# Patient Record
Sex: Female | Born: 1995 | Race: White | Hispanic: No | Marital: Single | Smoking: Never smoker
Health system: Southern US, Community
[De-identification: ages and names within clinical notes are randomized; demographics above are authoritative.]

## PROBLEM LIST (undated history)

## (undated) DIAGNOSIS — R0602 Shortness of breath: Secondary | ICD-10-CM

## (undated) DIAGNOSIS — R000 Tachycardia, unspecified: Secondary | ICD-10-CM

## (undated) DIAGNOSIS — F909 Attention-deficit hyperactivity disorder, unspecified type: Secondary | ICD-10-CM

## (undated) DIAGNOSIS — F419 Anxiety disorder, unspecified: Secondary | ICD-10-CM

## (undated) DIAGNOSIS — F988 Other specified behavioral and emotional disorders with onset usually occurring in childhood and adolescence: Secondary | ICD-10-CM

## (undated) HISTORY — DX: Attention-deficit hyperactivity disorder, unspecified type: F90.9

## (undated) HISTORY — PX: WISDOM TOOTH EXTRACTION: SHX21

## (undated) HISTORY — DX: Anxiety disorder, unspecified: F41.9

## (undated) HISTORY — DX: Tachycardia, unspecified: R00.0

## (undated) HISTORY — PX: ADENOIDECTOMY: SUR15

## (undated) HISTORY — DX: Shortness of breath: R06.02

## (undated) HISTORY — DX: Other specified behavioral and emotional disorders with onset usually occurring in childhood and adolescence: F98.8

---
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
## 2016-01-06 ENCOUNTER — Institutional Professional Consult (permissible substitution) (INDEPENDENT_AMBULATORY_CARE_PROVIDER_SITE_OTHER): Payer: 59 | Admitting: Family

## 2016-01-06 ENCOUNTER — Telehealth: Payer: Self-pay | Admitting: Cardiovascular Disease

## 2016-01-06 DIAGNOSIS — F411 Generalized anxiety disorder: Secondary | ICD-10-CM | POA: Diagnosis not present

## 2016-01-06 DIAGNOSIS — F902 Attention-deficit hyperactivity disorder, combined type: Secondary | ICD-10-CM | POA: Diagnosis not present

## 2016-01-06 NOTE — Telephone Encounter (Signed)
New message   Pt mother is calling because she wants the test results from the Echo

## 2016-01-06 NOTE — Telephone Encounter (Signed)
Returned call. Made mother aware I am waiting on result notes from Dr. Duke Salvia. She mentions Dr. Duke Salvia may put pt on BB if nothing found on labwork and imaging.  Aware I am deferring to Dr. Duke Salvia to advise.

## 2016-01-07 ENCOUNTER — Telehealth: Payer: Self-pay | Admitting: *Deleted

## 2016-01-07 NOTE — Telephone Encounter (Signed)
-----   Message from Chilton Si, MD sent at 01/06/2016  4:37 PM EST ----- Normal echo.

## 2016-01-07 NOTE — Telephone Encounter (Signed)
Spoke to patient's father.  ECHOResult given . Verbalized understanding

## 2016-02-17 ENCOUNTER — Ambulatory Visit (INDEPENDENT_AMBULATORY_CARE_PROVIDER_SITE_OTHER): Payer: 59 | Admitting: Cardiovascular Disease

## 2016-02-17 ENCOUNTER — Encounter: Payer: Self-pay | Admitting: Cardiovascular Disease

## 2016-02-17 VITALS — BP 126/84 | HR 112 | Ht 61.0 in | Wt 109.0 lb

## 2016-02-17 DIAGNOSIS — I4711 Inappropriate sinus tachycardia, so stated: Secondary | ICD-10-CM | POA: Insufficient documentation

## 2016-02-17 DIAGNOSIS — R000 Tachycardia, unspecified: Secondary | ICD-10-CM | POA: Diagnosis not present

## 2016-02-17 HISTORY — DX: Inappropriate sinus tachycardia, so stated: I47.11

## 2016-02-17 HISTORY — DX: Tachycardia, unspecified: R00.0

## 2016-02-17 MED ORDER — METOPROLOL SUCCINATE ER 25 MG PO TB24: ORAL_TABLET | ORAL | Status: DC

## 2016-02-17 NOTE — Progress Notes (Signed)
Cardiology Office Note   Date:  02/17/2016   ID:  Kristine Edwards, DOB 19-Aug-1996, MRN [id number]  PCP:  Tobias Alexander, MD  Cardiologist:   Madilyn Hook, MD   Chief Complaint  Patient presents with  . Follow-up     no chest pain, occassional shortness of breath, no edema, no pain or cramping in legs, occassional lightheadedness or dizziness-triggered with exercise, occassional fatigue      Patient ID: Kristine Edwards is a 20 y.o. female who presents for an evaluation of resting tachycardia.   Interval History 02/17/16: After her last appointment [name] had lab work that revealed normal kidney function, thyroid function and electrolytes.  She had an echocardiogram that revealed LVEF 60-65% and no other significant abnormalities.  The atrial septum was not well-visualized.  She continues to have exertional dyspnea.  She went walking with her mom and was unable to keep up.  She denies any chest pain or shortness of breath.  She is planning to go to [place] and [place] on Thursday.  She is concerned because she will be walking up to 10 miles daily.  She continues to deny stress or anxiety.   History of Present Illness 12/17/15:  [name] was seen in the emergency department on 11/18/15 after fracturing her elbow.  At the time she was noted to be tachycardiac with heart rates ranging from 115 to 128 bpm.   She was also noted to have a murmur on exam. She reports a long-standing history of tachycardia at least since middle school.  [name] was evaluated by a pediatric cardiologist in 2014. She was tachycardic at the time and underwent 2 Holter monitors. The first was while taking Vyvanse and the second occurred two days later after holding the medication.  Reportedly, there was no change in her tachycardia.  The ambulatory monitoring and echocardiogram were reportedly unremarkable other than tachycardia and a patent foramen ovale.  [name] reports exertional fatigue with  minimal exertion. After skating one lap around the ice skating rink or jogging for a short period of time she has to stop and rest.  She does not get any regular exercise.She denies any chest pain with activity.  However she is unable to complete past such as walking up stairs carrying groceries without significant dyspnea.  She also sometimes notes palpitations with exertion.at rest she does not feel as though her heart is beating fast. She has noted some lightheaded but denies syncope.  The episodes are not associated with nausea or diaphoresis.  She has not noted any lower extremity edema, orthopnea or PND.  [name] does not drink any caffeine. She also denies over-the-counter decongestants or nonprescription drugs.  She takes sertralinefor anxiety but denies feeling anxious recently.  She does state that she has been stressed out about being a senior in high school.  Past Medical History  Diagnosis Date  . ADD (attention deficit disorder) 12/17/2015  . Anxiety 12/17/2015  . Sinus tachycardia (HCC) 12/17/2015  . Shortness of breath 12/17/2015  . Inappropriate sinus tachycardia (HCC) 02/17/2016    Past Surgical History  Procedure Laterality Date  . Wisdom tooth extraction    . Adenoidectomy       Current Outpatient Prescriptions  Medication Sig Dispense Refill  . Adapalene 0.3 % gel Use as directed    . lisdexamfetamine (VYVANSE) 20 MG capsule Take 20 mg by mouth as needed. As needed    . lisdexamfetamine (VYVANSE) 40 MG capsule  Take 80 mg by mouth daily with breakfast. Take 2 tabs daily    . Multiple Vitamins-Minerals (HM MULTIVITAMIN ADULT GUMMY PO) Take 1 each by mouth daily.    . sertraline (ZOLOFT) 100 MG tablet Take 100 mg by mouth daily. Take 1 tab daily    . metoprolol succinate (TOPROL XL) 25 MG 24 hr tablet TAKE 1/2 TO 1 TABLET DAILY AS DIRECTED 30 tablet 1   No current facility-administered medications for this visit.    Allergies:   Review of patient's allergies indicates  no known allergies.    Social History:  The patient  reports that she has never smoked. She has never used smokeless tobacco.   Family History:  The patient's family history includes Cancer in her maternal grandfather; Heart disease in her maternal grandfather; Other in her father; Seizures in her maternal grandmother; Stroke in her paternal grandfather.    ROS:  Please see the history of present illness.   Otherwise, review of systems are positive for none.   All other systems are reviewed and negative.    PHYSICAL EXAM: VS:  BP 126/84 mmHg  Pulse 112  Ht 5\' 1"  (1.549 m)  Wt 49.442 kg (109 lb)  BMI 20.61 kg/m2  LMP 02/03/2016 , BMI Body mass index is 20.61 kg/(m^2). GENERAL:  Well appearing.  Anxious HEENT:  Pupils equal round and reactive, fundi not visualized, oral mucosa unremarkable NECK:  No jugular venous distention, waveform within normal limits, carotid upstroke brisk and symmetric, no bruits LYMPHATICS:  No cervical adenopathy LUNGS:  Clear to auscultation bilaterally HEART:  RRR.  PMI not displaced or sustained,S1 and S2 within normal limits, no S3, no S4, no clicks, no rubs, no murmurs ABD:  Flat, positive bowel sounds normal in frequency in pitch, no bruits, no rebound, no guarding, no midline pulsatile mass, no hepatomegaly, no splenomegaly EXT:  2 plus pulses throughout, no edema, no cyanosis no clubbing SKIN:  No rashes no nodules NEURO:  Cranial nerves II through XII grossly intact, motor grossly intact throughout PSYCH:  Cognitively intact, oriented to person place and time    EKG:  EKG is ordered today. The ekg ordered today demonstrates sinus tachycardia rate 118 bpm.   Recent Labs: 12/26/2015: BUN 11; Creat 0.67; Magnesium 2.2; Potassium 4.1; Sodium 137; TSH 1.391    Lipid Panel No results found for: CHOL, TRIG, HDL, CHOLHDL, VLDL, LDLCALC, LDLDIRECT    Wt Readings from Last 3 Encounters:  02/17/16 49.442 kg (109 lb) (16 %*, Z = -0.98)  12/17/15  47.826 kg (105 lb 7 oz) (11 %*, Z = -1.22)  11/18/15 50.519 kg (111 lb 6 oz) (22 %*, Z = -0.77)   * Growth percentiles are based on CDC 2-20 Years data.      ASSESSMENT AND PLAN:  # Inappropriate sinus tachycardia: Ms. Harvest DarkMcKane's tacycardia is not due to anxiety, thryoid disease, or anemia.  Her risk for chronic PE is very low.  Echo did not reveal any structural abnormalities.  Symptoms were not any different when she stopped Vyvanse.  We will start metoprolol succinate 12.5 mg daily with plans to titrate as needed.  If this does not work, we will consider Ivabradine.    Current medicines are reviewed at length with the patient today.  The patient does not have concerns regarding medicines.  The following changes have been made:  Metoprolol succinate 12.5 mg daily  Labs/ tests ordered today include:   Orders Placed This Encounter  Procedures  . EKG  12-Lead     Disposition:   FU with Denson Niccoli C. Duke Salvia, MD, [facility] in 1 month.    This note was written with the assistance of speech recognition software.  Please excuse any transcriptional errors.  Signed, Fruma Africa C. Duke Salvia, MD, [facility]  02/17/2016 5:01 PM    [facility]

## 2016-02-17 NOTE — Patient Instructions (Signed)
Medication Instructions:  START METOPROLOL SUCC 25 MG 1/2 TABLET DAILY INCREASE TO A FULL TABLET DAILY IF YOUR HEART RATE STAYS ABOVE 100 AND BLOOD PRESSURE ABOVE 110/60  Labwork: NONE  Testing/Procedures: NONE  Follow-Up: Your physician recommends that you schedule a follow-up appointment in: 1 MONTH OV  If you need a refill on your cardiac medications before your next appointment, please call your pharmacy.

## 2016-03-03 ENCOUNTER — Other Ambulatory Visit: Payer: Self-pay | Admitting: Family

## 2016-03-03 DIAGNOSIS — F902 Attention-deficit hyperactivity disorder, combined type: Secondary | ICD-10-CM

## 2016-03-03 MED ORDER — LISDEXAMFETAMINE DIMESYLATE 20 MG PO CAPS: 20.0000 mg | ORAL_CAPSULE | ORAL | Status: DC | PRN

## 2016-03-03 MED ORDER — LISDEXAMFETAMINE DIMESYLATE 40 MG PO CAPS: 80.0000 mg | ORAL_CAPSULE | Freq: Every day | ORAL | Status: DC

## 2016-03-03 NOTE — Telephone Encounter (Signed)
Printed Rx and placed at front desk for pick-up  

## 2016-03-03 NOTE — Telephone Encounter (Signed)
Mom called for refill for Vyvanse 40 mg and Vyvanse 20 mg.  Patient last seen 01/06/16, next appointment 04/03/16.

## 2016-03-13 ENCOUNTER — Ambulatory Visit: Payer: Self-pay | Admitting: Cardiovascular Disease

## 2016-04-03 ENCOUNTER — Ambulatory Visit (INDEPENDENT_AMBULATORY_CARE_PROVIDER_SITE_OTHER): Payer: 59 | Admitting: Family

## 2016-04-03 ENCOUNTER — Telehealth: Payer: Self-pay | Admitting: Family

## 2016-04-03 ENCOUNTER — Encounter: Payer: Self-pay | Admitting: Family

## 2016-04-03 VITALS — BP 102/62 | HR 98 | Resp 16 | Ht 61.0 in | Wt 107.8 lb

## 2016-04-03 DIAGNOSIS — F902 Attention-deficit hyperactivity disorder, combined type: Secondary | ICD-10-CM | POA: Diagnosis not present

## 2016-04-03 DIAGNOSIS — F411 Generalized anxiety disorder: Secondary | ICD-10-CM

## 2016-04-03 MED ORDER — VYVANSE 20 MG PO CAPS: 20.0000 mg | ORAL_CAPSULE | ORAL | Status: DC | PRN

## 2016-04-03 MED ORDER — LISDEXAMFETAMINE DIMESYLATE 40 MG PO CAPS: 80.0000 mg | ORAL_CAPSULE | Freq: Every day | ORAL | Status: DC

## 2016-04-03 NOTE — Telephone Encounter (Signed)
Mother requested script when here for after appointment.

## 2016-04-03 NOTE — Progress Notes (Addendum)
[facility] [address] [place] [address] Dept: [phone] Dept Fax: [phone] Loc: [phone] Loc Fax: [phone]  Medical Follow-up  Patient ID: Kristine Edwards, female  DOB: December 30, 1995, 20 y.o.  MRN: [id number]  Date of Evaluation: 04/03/16  PCP: Kristine AlexanderAMOS, JACK E, MD  Accompanied by: Mother Patient Lives with: parents  HISTORY/CURRENT STATUS:  HPI  Patient here for routine follow up related to ADHD and medication management. Currently taking Vyvanse 40 mg 2 tablets in the morning and 20 mg 1 tablet in the pm as needed. Doing well without any side effects or adverse effects. Polite and cooperative at the follow up visit.   EDUCATION: School: [employer] Year/Grade: 12th grade Homework Time: 2 Hours. Staying after school to get service hours to complete her last 28 hours. Performance/Grades: average Services: Other: Tutoring after school or help as needed Activities/Exercise: intermittently  MEDICAL HISTORY: Appetite: Good MVI/Other: Occasional Fruits/Vegs:some Calcium: some Iron:some  Sleep: Bedtime: 12:00 am Awakens: 6:15 am Sleep Concerns: Initiation/Maintenance/Other: Staying up late on the computer  Individual Medical History/Review of System Changes? Yes, testing completed with Cardiology and started on Toprol XL 25 mg 1/2 tablet started in March. Has f/u appointment next Friday at 4:00 pm with Dr. Duke Salviaandolph, cardiology. Mother to seek 2nd opinion for cardiology diagnosis and [facility].  Allergies: Review of patient's allergies indicates no known allergies.  Current Medications:  Current outpatient prescriptions:  .  Adapalene 0.3 % gel, Use as directed, Disp: , Rfl:  .  lisdexamfetamine (VYVANSE) 20 MG capsule, Take 1 capsule (20 mg total) by mouth as needed. As needed, Disp: 30 capsule,  Rfl: 0 .  lisdexamfetamine (VYVANSE) 40 MG capsule, Take 2 capsules (80 mg total) by mouth daily with breakfast. Take 2 tabs daily, Disp: 60 capsule, Rfl: 0 .  metoprolol succinate (TOPROL XL) 25 MG 24 hr tablet, TAKE 1/2 TO 1 TABLET DAILY AS DIRECTED, Disp: 30 tablet, Rfl: 1 .  sertraline (ZOLOFT) 100 MG tablet, Take 100 mg by mouth daily. Take 1 tab daily, Disp: , Rfl:  .  Multiple Vitamins-Minerals (HM MULTIVITAMIN ADULT GUMMY PO), Take 1 each by mouth daily. Reported on 04/03/2016, Disp: , Rfl:  Medication Side Effects: None  Family Medical/Social History Changes?: No  MENTAL HEALTH: Mental Health Issues: Anxiety-some increased recently  PHYSICAL EXAM: Vitals:  Today's Vitals   04/03/16 0809  Height: 5\' 1"  (1.549 m)  Weight: 107 lb 12.8 oz (48.898 kg)  , 35%ile (Z=-0.37) based on CDC 2-20 Years BMI-for-age data using vitals from 04/03/2016.  General Exam: Physical Exam  Constitutional: She is oriented to person, place, and time. She appears well-developed and well-nourished.  HENT:  Head: Normocephalic.  Right Ear: External ear normal.  Left Ear: External ear normal.  Nose: Nose normal.  Mouth/Throat: Oropharynx is clear and moist.  Eyes: Conjunctivae and EOM are normal. Pupils are equal, round, and reactive to light.  Neck: Normal range of motion.  Cardiovascular: Normal rate, regular rhythm, normal heart sounds and intact distal pulses.   Pulmonary/Chest: Effort normal and breath sounds normal.  Abdominal: Bowel sounds are normal.  Musculoskeletal: Normal range of motion.       Arms: Neurological: She is alert and oriented to person, place, and time. She has normal reflexes.  Skin: Skin is warm and dry.  Psychiatric: She has a normal mood and affect. Her behavior is  normal. Judgment and thought content normal.  Vitals reviewed.   Neurological: oriented to time, place, and person Cranial Nerves: normal  Neuromuscular:  Motor Mass: Normal Tone: Normal Strength:  Normal DTRs: 2+ and symmetric Overflow: None Reflexes: no tremors noted Sensory Exam: Vibratory: Intact  Fine Touch: Intact  Testing/Developmental Screens:  ASRS-30/14 scored by patient and reviewed at today's visit     DIAGNOSES:    ICD-9-CM ICD-10-CM   1. 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 Generalized anxiety disorder 300.02 F41.1     RECOMMENDATIONS: 3 month follow up and continuation with medication management. To refer to Dr. Jolene Provost for life coaching after graduation in May from high school.  NEXT APPOINTMENT: No Follow-up on file.   More than 50% of the appointment was spent counseling and discussing diagnosis and management of symptoms with the patient and family.  Carron Curie, NP

## 2016-04-10 ENCOUNTER — Encounter: Payer: Self-pay | Admitting: Cardiovascular Disease

## 2016-04-10 ENCOUNTER — Ambulatory Visit (INDEPENDENT_AMBULATORY_CARE_PROVIDER_SITE_OTHER): Payer: 59 | Admitting: Cardiovascular Disease

## 2016-04-10 VITALS — BP 98/80 | HR 103 | Ht 61.01 in | Wt 107.0 lb

## 2016-04-10 DIAGNOSIS — R000 Tachycardia, unspecified: Secondary | ICD-10-CM

## 2016-04-10 MED ORDER — IVABRADINE HCL 5 MG PO TABS: 2.5000 mg | ORAL_TABLET | Freq: Two times a day (BID) | ORAL | Status: DC

## 2016-04-10 NOTE — Progress Notes (Signed)
Cardiology Office Note   Date:  04/10/2016   ID:  Kristine Edwards, DOB [birth year], MRN [id number]  PCP:  Tobias AlexanderAMOS, JACK E, MD  Cardiologist:   Chilton Siiffany Shungnak, MD   Chief Complaint  Patient presents with  . Follow-up    asymptomatic today      Patient ID: Kristine Edwards is a 20 y.o. female with inappropriate sinus tachycardia who presents for follow-up.  History of Present Illness: Kristine Edwards is a pleasant, 20 year old woman with inappropriate sinus tachycardia and ADHD who presents for follow-up.  She initially was noted to have inappropriate heart rates in December 2016 when she presented to the emergency department with a fractured elbow. At that time her heart rate was ranging from 115 to 128.  Her heart rate has been elevated since at least middle school and she's noted exercise intolerance.   Kristine Edwards was evaluated by a pediatric cardiologist in 2014. She was tachycardic at the time and underwent 2 Holter monitors. The first was while taking Vyvanse and the second occurred two days later after holding the medication.  Reportedly, there was no change in her tachycardia.  The ambulatory monitoring and echocardiogram were reportedly unremarkable other than tachycardia and a patent foramen ovale.  Kristine Edwards does not drink any caffeine. She also denies over-the-counter decongestants or nonprescription drugs.  She takes sertralinefor anxiety but denies feeling anxious recently.  She does state that she has been stressed out about being a senior in high school.  In January 2017 she had an echo that revealed LVEF 60-65% and no other significant abnormalities.  Kristine Edwards had lab work that revealed normal kidney function, thyroid function and electrolytes.  Because of these exertional symptoms she was started on metoprolol 12.5 mg daily at the last appointment.  She has not noted any improvement in her shortness of breath.  Her heart rate remains over 100 bpm.  [place] went [place]  for school and enjoyed herself but lagged behind the group due to shortness of breath.   Past Medical History  Diagnosis Date  . ADD (attention deficit disorder) 12/17/2015  . Anxiety 12/17/2015  . Sinus tachycardia (HCC) 12/17/2015  . Shortness of breath 12/17/2015  . Inappropriate sinus tachycardia (HCC) 02/17/2016    Past Surgical History  Procedure Laterality Date  . Wisdom tooth extraction    . Adenoidectomy       Current Outpatient Prescriptions  Medication Sig Dispense Refill  . Adapalene 0.3 % gel Use as directed    . lisdexamfetamine (VYVANSE) 40 MG capsule Take 2 capsules (80 mg total) by mouth daily with breakfast. Take 2 tabs daily 60 capsule 0  . metoprolol succinate (TOPROL XL) 25 MG 24 hr tablet TAKE 1/2 TO 1 TABLET DAILY AS DIRECTED 30 tablet 1  . sertraline (ZOLOFT) 100 MG tablet Take 100 mg by mouth daily. Take 1 tab daily    . VYVANSE 20 MG capsule Take 1 capsule (20 mg total) by mouth as needed. As needed 30 capsule 0  . ivabradine (CORLANOR) 5 MG TABS tablet Take 0.5 tablets (2.5 mg total) by mouth 2 (two) times daily with a meal. 30 tablet 5   No current facility-administered medications for this visit.    Allergies:   Review of patient's allergies indicates no known allergies.    Social History:  The patient  reports that she has never smoked. She has never used smokeless tobacco.   Family History:  The patient's family history  includes Cancer in her maternal grandfather; Heart disease in her maternal grandfather; Other in her father; Seizures in her maternal grandmother; Stroke in her paternal grandfather.    ROS:  Please see the history of present illness.   Otherwise, review of systems are positive for none.   All other systems are reviewed and negative.    PHYSICAL EXAM: VS:  BP 98/80 mmHg  Pulse 103  Ht 5' 1.01" (1.55 m)  Wt 48.535 kg (107 lb)  BMI 20.20 kg/m2  LMP 03/30/2016 (Exact Date) , BMI Body mass index is 20.2 kg/(m^2). GENERAL:  Well  appearing.  Anxious HEENT:  Pupils equal round and reactive, fundi not visualized, oral mucosa unremarkable NECK:  No jugular venous distention, waveform within normal limits, carotid upstroke brisk and symmetric, no bruits LYMPHATICS:  No cervical adenopathy LUNGS:  Clear to auscultation bilaterally HEART:  RRR.  PMI not displaced or sustained,S1 and S2 within normal limits, no S3, no S4, no clicks, no rubs, no murmurs ABD:  Flat, positive bowel sounds normal in frequency in pitch, no bruits, no rebound, no guarding, no midline pulsatile mass, no hepatomegaly, no splenomegaly EXT:  2 plus pulses throughout, no edema, no cyanosis no clubbing SKIN:  No rashes no nodules NEURO:  Cranial nerves II through XII grossly intact, motor grossly intact throughout PSYCH:  Cognitively intact, oriented to person place and time   EKG:  EKG is ordered today. The ekg ordered today demonstrates sinus tachycardia rate 118 bpm.   Recent Labs: 12/26/2015: BUN 11; Creat 0.67; Magnesium 2.2; Potassium 4.1; Sodium 137; TSH 1.391    Lipid Panel No results found for: CHOL, TRIG, HDL, CHOLHDL, VLDL, LDLCALC, LDLDIRECT    Wt Readings from Last 3 Encounters:  04/10/16 48.535 kg (107 lb) (13 %*, Z = -1.14)  04/03/16 48.898 kg (107 lb 12.8 oz) (14 %*, Z = -1.08)  02/17/16 49.442 kg (109 lb) (16 %*, Z = -0.98)   * Growth percentiles are based on CDC 2-20 Years data.      ASSESSMENT AND PLAN:  # Inappropriate sinus tachycardia: Kristine Edwards tacycardia is not due to anxiety, thryoid disease, or anemia.  Her risk for chronic PE is very low.  Echo did not reveal any structural abnormalities.  Symptoms were not any different when she stopped Vyvanse. She tired metoprolol but continues to have shorness of breath.  Her heart rate remains >100 bpm and her BP is now 98/80.  Therefore we cannot titrate any more.  We will start Ivabradine 2.5 mg bid with plans to increase to  bid if her heart rate remains >100 bpm.      Current medicines are reviewed at length with the patient today.  The patient does not have concerns regarding medicines.  The following changes have been made:  Stop metoprolol succinate and start Ivabradine 2.5 mg bid.  Labs/ tests ordered today include:   Orders Placed This Encounter  Procedures  . EKG 12-Lead     Disposition:   FU with Kristine Keo C. Duke Salvia, MD, [facility] in 1 month.  If symptoms are well-controlled she can cancel and follow up in 1 year.    This note was written with the assistance of speech recognition software.  Please excuse any transcriptional errors.  Signed, Damieon Armendariz C. Duke Salvia, MD, [facility]  04/10/2016 4:43 PM    [facility]

## 2016-04-10 NOTE — Patient Instructions (Signed)
Medication Instructions:  STOP METOPROLOL   START CORLANOR 2.5 MG TWICE A DAY IF YOUR HEART RATE STAYS ABOVE 100 INCREASE 5 MG TWICE A DAY   Labwork: NONE  Testing/Procedures: NONE  Follow-Up: Your physician recommends that you schedule a follow-up appointment in: 1 MONTH OV  If you need a refill on your cardiac medications before your next appointment, please call your pharmacy.

## 2016-05-07 ENCOUNTER — Other Ambulatory Visit: Payer: Self-pay | Admitting: Family

## 2016-05-07 DIAGNOSIS — F902 Attention-deficit hyperactivity disorder, combined type: Secondary | ICD-10-CM

## 2016-05-07 MED ORDER — VYVANSE 40 MG PO CAPS: 80.0000 mg | ORAL_CAPSULE | Freq: Every day | ORAL | Status: DC

## 2016-05-07 NOTE — Telephone Encounter (Signed)
Mom called for refill for Vyvanse 40 mg.  Patient last seen 04/03/16, next appointment 06/24/16.

## 2016-05-07 NOTE — Telephone Encounter (Signed)
Printed Rx and placed at front desk for pick-up-Vyvanse 40 mg 2 daily. 

## 2016-05-11 ENCOUNTER — Telehealth: Payer: Self-pay | Admitting: Family

## 2016-05-11 DIAGNOSIS — F411 Generalized anxiety disorder: Secondary | ICD-10-CM

## 2016-05-11 MED ORDER — SERTRALINE HCL 100 MG PO TABS: 100.0000 mg | ORAL_TABLET | Freq: Every day | ORAL | Status: DC

## 2016-05-11 NOTE — Telephone Encounter (Signed)
[facility] requesting refill for Sertraline 100 mg.

## 2016-05-11 NOTE — Telephone Encounter (Signed)
E prescribed 90 day supply to [facility] with no refills. Next appt 06/21/16

## 2016-05-19 ENCOUNTER — Ambulatory Visit (INDEPENDENT_AMBULATORY_CARE_PROVIDER_SITE_OTHER): Payer: 59 | Admitting: Cardiovascular Disease

## 2016-05-19 ENCOUNTER — Encounter: Payer: Self-pay | Admitting: Cardiovascular Disease

## 2016-05-19 VITALS — BP 116/80 | HR 108 | Ht 61.0 in | Wt 107.4 lb

## 2016-05-19 DIAGNOSIS — R000 Tachycardia, unspecified: Secondary | ICD-10-CM

## 2016-05-19 MED ORDER — IVABRADINE HCL 5 MG PO TABS
5.0000 mg | ORAL_TABLET | Freq: Two times a day (BID) | ORAL | Status: DC
Start: 2016-05-19 — End: 2017-01-08

## 2016-05-19 NOTE — Progress Notes (Signed)
Cardiology Office Note   Date:  05/19/2016   ID:  Kristine Edwards, DOB [birth year], MRN [id number]  PCP:  Tobias Alexander, MD  Cardiologist:   Chilton Si, MD   Chief Complaint  Patient presents with  . Follow-up    1 month  pt states no new Sx.--is feeling tired today; first pulse taken was 129; down to 108 during EKG      Patient ID: Kristine Edwards is a 20 y.o. female with inappropriate sinus tachycardia who presents for follow-up.  History of Present Illness: Kristine Edwards is a pleasant, 20 year old woman with inappropriate sinus tachycardia and ADHD who presents for follow-up.  She initially was noted to have inappropriate heart rates in December 2016 when she presented to the emergency department with a fractured elbow.  At that time her heart rate was ranging from 115 to 128.  Her heart rate has been elevated since at least middle school and she's noted exercise intolerance.   Kristine Edwards was evaluated by a pediatric cardiologist in 2014. She was tachycardic at the time and underwent 2 Holter monitors. The first was while taking Vyvanse and the second occurred two days later after holding the medication.  Reportedly, there was no change in her tachycardia.  The ambulatory monitoring and echocardiogram were reportedly unremarkable other than tachycardia and a patent foramen ovale.  Kristine Edwards does not drink any caffeine. She also denies over-the-counter decongestants or nonprescription drugs.  She takes sertralinefor anxiety but denies feeling anxious recently.  She does state that she has been stressed out about being a senior in high school.  In January 2017 she had an echo that revealed LVEF 60-65% and no other significant abnormalities.  Kristine Edwards had lab work that revealed normal kidney function, thyroid function and electrolytes.  Because of these exertional symptoms she was started on metoprolol 12.5 mg daily at the last appointment.  She did not noted any improvement in  her shortness of breath so she was started on Ivabradine.  She notes that she frequently forgets to take the evening dose. She thinks that her shortness of breath has been improved, though she does not exert herself much. Her heart rates have ranged from the upper 90s to low 100s. She denies any lower extremity edema, orthopnea, PND, lightheadedness or dizziness.   Past Medical History  Diagnosis Date  . ADD (attention deficit disorder) 12/17/2015  . Anxiety 12/17/2015  . Sinus tachycardia (HCC) 12/17/2015  . Shortness of breath 12/17/2015  . Inappropriate sinus tachycardia (HCC) 02/17/2016    Past Surgical History  Procedure Laterality Date  . Wisdom tooth extraction    . Adenoidectomy       Current Outpatient Prescriptions  Medication Sig Dispense Refill  . Adapalene 0.3 % gel Use as directed    . ivabradine (CORLANOR) 5 MG TABS tablet Take 1 tablet (5 mg total) by mouth 2 (two) times daily with a meal. 60 tablet 6  . metoprolol succinate (TOPROL XL) 25 MG 24 hr tablet TAKE 1/2 TO 1 TABLET DAILY AS DIRECTED 30 tablet 1  . sertraline (ZOLOFT) 100 MG tablet Take 1 tablet (100 mg total) by mouth daily. Take 1 tab daily 90 tablet 0  . VYVANSE 40 MG capsule Take 2 capsules (80 mg total) by mouth daily with breakfast. Take 2 tabs daily 60 capsule 0   No current facility-administered medications for this visit.    Allergies:   Review of patient's allergies indicates no known allergies.  Social History:  The patient  reports that she has never smoked. She has never used smokeless tobacco.   Family History:  The patient's family history includes Cancer in her maternal grandfather; Heart disease in her maternal grandfather; Other in her father; Seizures in her maternal grandmother; Stroke in her paternal grandfather.    ROS:  Please see the history of present illness.   Otherwise, review of systems are positive for none.   All other systems are reviewed and negative.    PHYSICAL  EXAM: VS:  BP 116/80 mmHg  Pulse 108  Ht 5\' 1"  (1.549 m)  Wt 107 lb 6.4 oz (48.716 kg)  BMI 20.30 kg/m2 , BMI Body mass index is 20.3 kg/(m^2). GENERAL:  Well appearing.  Anxious HEENT:  Pupils equal round and reactive, fundi not visualized, oral mucosa unremarkable NECK:  No jugular venous distention, waveform within normal limits, carotid upstroke brisk and symmetric, no bruits LYMPHATICS:  No cervical adenopathy LUNGS:  Clear to auscultation bilaterally HEART:  Tachycardic.  Regular rhythm. PMI not displaced or sustained,S1 and S2 within normal limits, no S3, no S4, no clicks, no rubs, no murmurs ABD:  Flat, positive bowel sounds normal in frequency in pitch, no bruits, no rebound, no guarding, no midline pulsatile mass, no hepatomegaly, no splenomegaly EXT:  2 plus pulses throughout, no edema, no cyanosis no clubbing SKIN:  No rashes no nodules NEURO:  Cranial nerves II through XII grossly intact, motor grossly intact throughout PSYCH:  Cognitively intact, oriented to person place and time   EKG:  EKG is ordered today. The ekg ordered today demonstrates sinus tachycardia rate 118 bpm.   Recent Labs: 12/26/2015: BUN 11; Creat 0.67; Magnesium 2.2; Potassium 4.1; Sodium 137; TSH 1.391    Lipid Panel No results found for: CHOL, TRIG, HDL, CHOLHDL, VLDL, LDLCALC, LDLDIRECT    Wt Readings from Last 3 Encounters:  05/19/16 107 lb 6.4 oz (48.716 kg) (13 %*, Z = -1.13)  04/10/16 107 lb (48.535 kg) (13 %*, Z = -1.14)  04/03/16 107 lb 12.8 oz (48.898 kg) (14 %*, Z = -1.08)   * Growth percentiles are based on CDC 2-20 Years data.      ASSESSMENT AND PLAN:  # Inappropriate sinus tachycardia: Kristine Edwards tacycardia is not due to anxiety, thryoid disease, or anemia.  Her risk for chronic PE is very low.  Echo did not reveal any structural abnormalities.  Symptoms were not any different when she stopped Vyvanse. She tried metoprolol but continued to have shorness of breath and her BP  was low.  She was started on Ivabradine 2.5 mg bid with minimal improvement in her heart rate. We'll increase it to 5 mg twice a day. She was encouraged to develop a system to help her remember taking the medication at night.    Current medicines are reviewed at length with the patient today.  The patient does not have concerns regarding medicines.  The following changes have been made:  Increase Ivabradine to 5 mg bid.  Labs/ tests ordered today include:   Orders Placed This Encounter  Procedures  . EKG 12-Lead   Time spent: 20 minutes-Greater than 50% of this time was spent in counseling, explanation of diagnosis, planning of further management, and coordination of care.   Disposition:   FU with Shawanda Sievert C. Duke Salvia, MD, [facility] in 3 months   This note was written with the assistance of speech recognition software.  Please excuse any transcriptional errors.  Signed, Claribel Sachs C. Duke Salvia,  MD, [facility]  05/19/2016 1:10 PM    [facility]

## 2016-05-19 NOTE — Patient Instructions (Signed)
Medication Instructions:   INCREASE INVABRADINE TO 5 MG TWICE DAILY  Follow-Up:  Your physician recommends that you schedule a follow-up appointment in: 3 MONTHS WITH [facility]

## 2016-06-05 ENCOUNTER — Other Ambulatory Visit: Payer: Self-pay | Admitting: Family

## 2016-06-05 DIAGNOSIS — F902 Attention-deficit hyperactivity disorder, combined type: Secondary | ICD-10-CM

## 2016-06-05 MED ORDER — VYVANSE 40 MG PO CAPS: 80.0000 mg | ORAL_CAPSULE | Freq: Every day | ORAL | Status: DC

## 2016-06-05 NOTE — Telephone Encounter (Signed)
Mom called for refills for Vyvanse 40 mg and Sertraline.  Patient last seen 04/03/16, next appointment 06/24/16.

## 2016-06-05 NOTE — Telephone Encounter (Signed)
Printed Rx and placed at front desk for pick-up-Vyvanse 40 mg 2 daily. On 05/11/16 [facility].

## 2016-06-10 ENCOUNTER — Other Ambulatory Visit: Payer: Self-pay | Admitting: Family

## 2016-06-10 DIAGNOSIS — F411 Generalized anxiety disorder: Secondary | ICD-10-CM

## 2016-06-10 MED ORDER — SERTRALINE HCL 100 MG PO TABS: 100.0000 mg | ORAL_TABLET | Freq: Every day | ORAL | Status: DC

## 2016-06-10 NOTE — Telephone Encounter (Signed)
Received fax from [facility] requesting refill for Sertraline.  Patient last seen 04/03/16, next appointment 06/23/16.

## 2016-06-10 NOTE — Telephone Encounter (Signed)
Sertraline 100mg  escribed for 30 day supply.  90 day was ordered on 05/11/16 through epic. Fax received stated only 30 was ordered as noted by pharmacy.

## 2016-06-24 ENCOUNTER — Ambulatory Visit (INDEPENDENT_AMBULATORY_CARE_PROVIDER_SITE_OTHER): Payer: 59 | Admitting: Family

## 2016-06-24 ENCOUNTER — Encounter: Payer: Self-pay | Admitting: Family

## 2016-06-24 VITALS — BP 102/72 | HR 110 | Resp 18 | Ht 61.5 in | Wt 108.0 lb

## 2016-06-24 DIAGNOSIS — Z9189 Other specified personal risk factors, not elsewhere classified: Secondary | ICD-10-CM | POA: Diagnosis not present

## 2016-06-24 DIAGNOSIS — F411 Generalized anxiety disorder: Secondary | ICD-10-CM

## 2016-06-24 DIAGNOSIS — F902 Attention-deficit hyperactivity disorder, combined type: Secondary | ICD-10-CM

## 2016-06-24 MED ORDER — VYVANSE 40 MG PO CAPS: 80.0000 mg | ORAL_CAPSULE | Freq: Every day | ORAL | Status: DC

## 2016-06-24 MED ORDER — SERTRALINE HCL 100 MG PO TABS: 100.0000 mg | ORAL_TABLET | Freq: Every day | ORAL | Status: DC

## 2016-06-24 NOTE — Progress Notes (Signed)
[facility] [address], [address] [place] [address] Dept: [phone] Dept Fax: [phone] Loc: [phone] Loc Fax: [phone]  Medical Follow-up  Patient ID: Kristine Edwards, female  DOB: [birth year], 20 y.o.  MRN: [id number]  Date of Evaluation: 06/24/16  PCP: Tobias Alexander, MD  Accompanied by: Mother Patient Lives with: parents  HISTORY/CURRENT STATUS:  HPI  Patient here for routine follow up related to ADHD and medication management. Patient cooperative and interactive during follow up visit. Mother concerned with social media contact over the past few years that has become dangerous with meeting up with strangers. Patient not sure what is going on with future with school or volunteering with job applications.   EDUCATION: School: [employer] Year/Grade: 12th grade-Graduated recently Homework Time: None recently Performance/Grades: above average Services: IEP/504 Plan Activities/Exercise: rarely Trip to [employer] in October  MEDICAL HISTORY: Appetite: Good MVI/Other: None Fruits/Vegs:Some Calcium: Some Iron:Some  Sleep: Bedtime: 1:00 am Awakens: 9:00 am Sleep Concerns: Initiation/Maintenance/Other: Up late watching TV  Individual Medical History/Review of System Changes? Following up with cardiology for 2nd opinion.  Follow up with back doctor (Dr. Noel Gerold) locally and MRI scheduled and will see MD regarding scoliosis in [facility]).  Allergies: Review of patient's allergies indicates no known allergies.  Current Medications:  Current outpatient prescriptions:  .  Adapalene 0.3 % gel, Use as directed, Disp: , Rfl:  .  ivabradine (CORLANOR) 5 MG TABS tablet, Take 1 tablet (5 mg total) by mouth 2 (two) times daily with a meal., Disp: 60 tablet, Rfl: 6 .  metoprolol succinate (TOPROL XL) 25 MG 24 hr tablet,  TAKE 1/2 TO 1 TABLET DAILY AS DIRECTED, Disp: 30 tablet, Rfl: 1 .  sertraline (ZOLOFT) 100 MG tablet, Take 1 tablet (100 mg total) by mouth daily. Take 1 tab daily. 3 month supply., Disp: 90 tablet, Rfl: 0 .  VYVANSE 40 MG capsule, Take 2 capsules (80 mg total) by mouth daily with breakfast. Take 2 tabs daily, Disp: 60 capsule, Rfl: 0 Medication Side Effects: None  Family Medical/Social History Changes?: No  MENTAL HEALTH: Mental Health Issues: None, history of Anxiety that is being treated with Zoloft 100 mg 1 daily  PHYSICAL EXAM: Vitals:  Today's Vitals   06/24/16 1411  BP: 102/72  Pulse: 110  Resp: 18  Height: 5' 1.5" (1.562 m)  Weight: 108 lb (48.988 kg)  , 31%ile (Z=-0.51) based on CDC 2-20 Years BMI-for-age data using vitals from 06/24/2016.  General Exam: Physical Exam  Constitutional: She is oriented to person, place, and time. She appears well-developed and well-nourished.  HENT:  Head: Normocephalic and atraumatic.  Right Ear: External ear normal.  Left Ear: External ear normal.  Nose: Nose normal.  Mouth/Throat: Oropharynx is clear and moist.  Eyes: Conjunctivae and EOM are normal. Pupils are equal, round, and reactive to light.  Neck: Normal range of motion. Neck supple.  Cardiovascular: Normal rate, normal heart sounds and intact distal pulses.   Pulmonary/Chest: Effort normal and breath sounds normal.  Abdominal: Soft. Bowel sounds are normal.  Musculoskeletal: Normal range of motion.  Significant curvature in lumbar region  Neurological: She is alert and oriented to person, place, and time. She has normal reflexes.  Skin: Skin is warm and dry.    Neurological: oriented to time, place, and person Cranial Nerves: normal  Neuromuscular:  Motor Mass: Normal Tone: Normal Strength: Normal DTRs: 2+  and symmetric Overflow: None Reflexes: no tremors noted Sensory Exam: Vibratory: Intact  Fine Touch: Intact  Testing/Developmental Screens:  33/27  ASRS      DIAGNOSES:    ICD-9-CM ICD-10-CM   1. 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 Generalized anxiety disorder 300.02 F41.1 sertraline (ZOLOFT) 100 MG tablet  3. Lack of motivation V49.89 Z91.89     RECOMMENDATIONS: 3 month follow up and continuation with medication. Continue with Vyvanse 40 mg 2 daily, # 30 script printed today and escribed Zoloft 100 mg 1 daily, # 90 3 month supply.  Discussed dangers of social media with unknown sources who patient has been contacting. To follow up with Dr. Jolene ProvostMark Lewis for counseling services.  Discussed future goals along with plans for now and patient looking for job or volunteer work while figuring out healthcare needs for scoliosis and cardiac care.   NEXT APPOINTMENT: Return in about 3 months (around 09/24/2016) for follow up.  More than 50% of the appointment was spent counseling and discussing diagnosis and management of symptoms with the patient and family.  Carron Curieawn M Paretta-Leahey, NP Counseling Time: 30 mins Total Contact Time: 40 mins

## 2016-06-30 ENCOUNTER — Telehealth: Payer: Self-pay | Admitting: Cardiovascular Disease

## 2016-06-30 NOTE — Telephone Encounter (Signed)
Ms. Halleran does no have any special instructions for a dental procedure.  She can have any medications they deem necessary.  She is no on any blood thinners.  No antibiotics are indicate.

## 2016-06-30 NOTE — Telephone Encounter (Signed)
Inquiry routed to Dr. Duke Salvia to review.

## 2016-06-30 NOTE — Telephone Encounter (Signed)
New message   Wants to know can patient take laughing gas, valium,   1. What dental office are you calling from? Dr. Steele Berg  2. What is your office phone and fax number? [phone] / fax [phone]   3. What type of procedure is the patient having performed? Filling   4. What date is procedure scheduled? 8.22.2017 @ 11 am    5. What is your question (ex. Antibiotics prior to procedure, holding medication-we need to know how long dentist wants pt to hold med)? Please advise on heart condition

## 2016-07-01 NOTE — Telephone Encounter (Signed)
Spoke with [employer] and gave a verbal as well as sent via [employer]

## 2016-07-07 ENCOUNTER — Other Ambulatory Visit: Payer: Self-pay | Admitting: Orthopaedic Surgery

## 2016-07-07 DIAGNOSIS — M40294 Other kyphosis, thoracic region: Secondary | ICD-10-CM

## 2016-07-07 DIAGNOSIS — M791 Myalgia, unspecified site: Secondary | ICD-10-CM

## 2016-07-16 ENCOUNTER — Telehealth: Payer: Self-pay | Admitting: Cardiovascular Disease

## 2016-07-16 ENCOUNTER — Ambulatory Visit: Payer: 59 | Admitting: Psychologist

## 2016-07-16 NOTE — Telephone Encounter (Signed)
Clearance routed to Dr. Duke Salviaandolph.

## 2016-07-16 NOTE — Telephone Encounter (Signed)
Agree.  She can take all her medications as prescribed.

## 2016-07-16 NOTE — Telephone Encounter (Signed)
Request for surgical clearance:  1. What type of surgery is being performed? Filling on lower left tooth no#18  2. When is this surgery scheduled? 07/28/16 at 11 am   Are there any medications that need to be held prior to surgery and how long? Metoprolol Succinate and Ivabradine ( wants to be sure she can take because she will have laughing gas and Valum)   3. Name of physician performing surgery? Dr. Mariam DollarGoldenberg  4. What is your office phone and fax number?ph#[phone] fax#[phone]

## 2016-07-16 NOTE — Telephone Encounter (Signed)
Sent to Dawn via [employer]

## 2016-07-16 NOTE — Telephone Encounter (Signed)
The metoprolol and ivabradine will be fine with laughing gas and valium

## 2016-07-21 ENCOUNTER — Ambulatory Visit (INDEPENDENT_AMBULATORY_CARE_PROVIDER_SITE_OTHER): Payer: 59 | Admitting: Psychologist

## 2016-07-21 ENCOUNTER — Encounter: Payer: Self-pay | Admitting: Psychologist

## 2016-07-21 DIAGNOSIS — F411 Generalized anxiety disorder: Secondary | ICD-10-CM | POA: Diagnosis not present

## 2016-07-21 DIAGNOSIS — F902 Attention-deficit hyperactivity disorder, combined type: Secondary | ICD-10-CM

## 2016-07-21 NOTE — Progress Notes (Signed)
Intake 4 PM to 4:50 PM with both parents. Issues: Extreme anxiety, possible obsessive-compulsive disorder, extreme sensory integration issues including tactile, sound, taste, inappropriate Internet use including picture sending and receiving, extreme social isolation.  History: Kristine Edwards recently graduated from [employer]. She is currently living at home with her parents. She is not attending college and is not employed. She spends most of her time computer gaming. There've been multiple incidences of inappropriate photo sharing online, inappropriate in graphic conversations in chat rooms online, and attempts to meet men that she met online with no understanding of appreciation of the dangers she is placing herself in. Previously she met with a psychologist several years ago, however, it was described as not helpful.  Medical history: She takes Zoloft, Vyvanse, and a beta blocker for an accelerated heart rate. She has pending medical consultations at [facility] for her heart issues as well as Shannon's kyphosis.  Family medical history: There is a strong family medical history of psychiatric disorders. On her father's side there is considerable history of anxiety disorders and spectrum disorders. On her mother's side there are history of ADHD, anxiety, and bipolar disorder and schizophrenia in multiple first-degree relatives.  Mental status: Per parents, [name]'s mood's typically extremely anxious. Social relationships are almost nil and extremely strained. No evidence of any suicidal or homicidal ideation. Thoughts for the most part are described as clear relevant. There is no evidence of any psychotic process. Judgment and insight are deemed impaired. She is oriented to person place and time. Speech is described as goal-directed but extremely sparse. Currently there are no life ambitions or plans.  Plan: It was recommended that she began psychotherapy immediately. Parents  were cautioned that a psychiatric consult may be necessary. It was recommended that parents pursue and interest inventory online and that the results be discussed here in treatment.

## 2016-07-22 ENCOUNTER — Ambulatory Visit
Admission: RE | Admit: 2016-07-22 | Discharge: 2016-07-22 | Disposition: A | Discharge status: Home / Self Care | Payer: 59 | Source: Ambulatory Visit | Attending: Orthopaedic Surgery | Admitting: Orthopaedic Surgery

## 2016-07-22 DIAGNOSIS — M791 Myalgia, unspecified site: Secondary | ICD-10-CM

## 2016-07-22 DIAGNOSIS — M40294 Other kyphosis, thoracic region: Secondary | ICD-10-CM

## 2016-07-29 ENCOUNTER — Encounter: Payer: Self-pay | Admitting: Psychologist

## 2016-07-29 ENCOUNTER — Ambulatory Visit (INDEPENDENT_AMBULATORY_CARE_PROVIDER_SITE_OTHER): Payer: 59 | Admitting: Psychologist

## 2016-07-29 DIAGNOSIS — F411 Generalized anxiety disorder: Secondary | ICD-10-CM

## 2016-07-29 DIAGNOSIS — F902 Attention-deficit hyperactivity disorder, combined type: Secondary | ICD-10-CM

## 2016-07-29 NOTE — Progress Notes (Signed)
  [facility] [address], [address] [place] [address] Dept: [phone] Dept Fax: [phone] Loc: [phone] Loc Fax: [phone]  Psychology Therapy Session Progress Note  Patient ID: Kristine LikesCaelin Sara Edwards, female  DOB: 1996-02-06, 20 y.o.  MRN: [id number]  07/29/2016 Start time: 10 AM End time: 10:50 AM  Present: patient  Service provided: [id number] Individual Psychotherapy (45 min.)  Current Concerns: Anxiety, ADHD, social isolation, inappropriate Internet use, inappropriate Internet relationships, ambivalence regarding future (job, school, career)  Current Symptoms: Anxiety, Attention problem, Family Stress and Irritability  Mental Status: Appearance: Well Groomed Attention: good  Motor Behavior: Normal Affect: Full Range Mood: anxious Thought Process: normal Thought Content: normal Suicidal Ideation: None Homicidal Ideation:None Orientation: time, place and person Insight: Poor Judgement: Poor  Diagnosis: Generalized anxiety disorder, ADHD  Long Term Treatment Goals:  1) decrease anxiety 2) resist flight/freeze response 3) identify anxiety inducing thoughts 4) use relaxation strategies (deep breathing, visualization, cognitive cueing, muscle relaxation)   1) decrease impulsivity 2) increase self-monitoring 3) increase organizational skills 4) increase time management skills 5) increased behavioral regulation 6) increase self-monitoring 7) utilized cognitive behavioral principles    Anticipated Frequency of Visits: Weekly Anticipated Length of Treatment Episode: 6 months  Treatment Intervention: Cognitive Behavioral therapy and Supportive therapy  Response to Treatment: Neutral  Medical Necessity: Improved patient condition  Plan: CBT  Dugan Vanhoesen. MARK 07/29/2016

## 2016-08-04 ENCOUNTER — Ambulatory Visit (INDEPENDENT_AMBULATORY_CARE_PROVIDER_SITE_OTHER): Payer: 59 | Admitting: Psychologist

## 2016-08-04 ENCOUNTER — Telehealth: Payer: Self-pay | Admitting: Family

## 2016-08-04 ENCOUNTER — Encounter: Payer: Self-pay | Admitting: Psychologist

## 2016-08-04 DIAGNOSIS — F902 Attention-deficit hyperactivity disorder, combined type: Secondary | ICD-10-CM

## 2016-08-04 DIAGNOSIS — F411 Generalized anxiety disorder: Secondary | ICD-10-CM

## 2016-08-04 NOTE — Telephone Encounter (Signed)
Received fax from [facility] requesting prior authorization for Vyvanse 40 mg.  Patient last seen 06/24/16.

## 2016-08-04 NOTE — Progress Notes (Signed)
  [facility] [address], [address] [place] [address] Dept: [phone] Dept Fax: [phone] Loc: [phone] Loc Fax: [phone]  Psychology Therapy Session Progress Note  Patient ID: Kristine Edwards, female  DOB: 1996/04/16, 20 y.o.  MRN: [id number]  08/04/2016 Start time: 8 AM End time: 8:50 AM  Present: patient  Service provided: 90834P Individual Psychotherapy (45 min.)  Current Concerns: Anxiety, social isolation, sensory hypersensitivities, inappropriate Internet use, school/job/career, self-esteem  Current Symptoms: Anxiety  Mental Status: Appearance: Well Groomed Attention: good  Motor Behavior: Normal Affect: Full Range Mood: anxious Thought Process: normal Thought Content: normal Suicidal Ideation: None Homicidal Ideation:None Orientation: time, place and person Insight: Fair Judgement: Fair  Diagnosis: Anxiety disorder, ADHD  Long Term Treatment Goals:  1) decrease anxiety 2) resist flight/freeze response 3) identify anxiety inducing thoughts 4) use relaxation strategies (deep breathing, visualization, cognitive cueing, muscle relaxation)     Anticipated Frequency of Visits: Weekly Anticipated Length of Treatment Episode: 6 months  Treatment Intervention: Cognitive Behavioral therapy and Supportive therapy  Response to Treatment: Neutral  Medical Necessity: Improved patient condition  Plan: CBT  Micayla Brathwaite. MARK 08/04/2016

## 2016-08-04 NOTE — Telephone Encounter (Signed)
Unable to initiate a PA via cover my meds.  I had to call [facility] phone number [phone]. The "appropriate questionnaire will be faxed".

## 2016-08-06 NOTE — Telephone Encounter (Signed)
Received fax from [facility] that PA was approved. Effective 07/06/2016 to 08/04/2016

## 2016-08-06 NOTE — Telephone Encounter (Signed)
Questionnaire completed and faxed on 08/04/16. Awaiting response.

## 2016-08-12 ENCOUNTER — Encounter: Payer: Self-pay | Admitting: Psychologist

## 2016-08-12 ENCOUNTER — Ambulatory Visit (INDEPENDENT_AMBULATORY_CARE_PROVIDER_SITE_OTHER): Payer: 59 | Admitting: Psychologist

## 2016-08-12 DIAGNOSIS — F411 Generalized anxiety disorder: Secondary | ICD-10-CM | POA: Diagnosis not present

## 2016-08-12 NOTE — Progress Notes (Signed)
  [facility] [address], [address] [place] [address] Dept: [phone] Dept Fax: [phone] Loc: [phone] Loc Fax: [phone]  Psychology Therapy Session Progress Note  Patient ID: Kristine LikesCaelin Sara Broz, female  DOB: 02-May-1996, 20 y.o.  MRN: [id number]  08/12/2016 Start time: 3 PM End time: 3:50 PM  Present: patient  Service provided: [id number] Individual Psychotherapy (45 min.)  Current Concerns: Anxiety, social isolation, inappropriate Internet use. [facility]: 1 a back consult for her kyphosis, to a cardiac consult for her rapid heartbeat.  Current Symptoms: Anxiety  Mental Status: Appearance: Well Groomed Attention: good  Motor Behavior: Normal Affect: Full Range Mood: anxious Thought Process: normal Thought Content: normal Suicidal Ideation: None Homicidal Ideation:None Orientation: time, place and person Insight: Fair Judgement: Fair  Diagnosis: Generalized anxiety disorder  Long Term Treatment Goals:  1) decrease anxiety 2) resist flight/freeze response 3) identify anxiety inducing thoughts 4) use relaxation strategies (deep breathing, visualization, cognitive cueing, muscle relaxation)     Anticipated Frequency of Visits: Weekly to every other week Anticipated Length of Treatment Episode: 6 months  Treatment Intervention: Cognitive Behavioral therapy  Response to Treatment: Neutral  Medical Necessity: Improved patient condition  Plan: CBT  Sharon Rubis. MARK 08/12/2016

## 2016-08-24 ENCOUNTER — Ambulatory Visit: Payer: 59 | Admitting: Cardiovascular Disease

## 2016-08-26 ENCOUNTER — Encounter: Payer: Self-pay | Admitting: Psychologist

## 2016-08-26 ENCOUNTER — Ambulatory Visit (INDEPENDENT_AMBULATORY_CARE_PROVIDER_SITE_OTHER): Payer: 59 | Admitting: Psychologist

## 2016-08-26 DIAGNOSIS — F411 Generalized anxiety disorder: Secondary | ICD-10-CM

## 2016-08-26 NOTE — Progress Notes (Signed)
  [place] [facility]  [facility]OLOG[facility] [address], [address] [place] [address] Dept: [phone] Dept Fax: [phone] Loc: [phone] Loc Fax: [phone]  Psychology Therapy Session Progress Note  Patient ID: Kristine LikesCaelin Sara Ungerer, female  DOB: 09-11-1996, 20 y.o.  MRN: [id number]  08/26/2016 Start time: 8 AM  End time: 8:50 AM  Present: mother and patient  Service provided: 90834P Individual Psychotherapy (45 min.)  Current Concerns: Anxiety, [facility] and Sherman's kyphosis  Current Symptoms: Anxiety  Mental Status: Appearance: Well Groomed Attention: good  Motor Behavior: Normal Affect: Full Range Mood: anxious Thought Process: normal Thought Content: normal Suicidal Ideation: None Homicidal Ideation:None Orientation: time, place and person Insight: Fair Judgement: Fair  Diagnosis: Generalized anxiety disorder  Long Term Treatment Goals:  1) decrease anxiety 2) resist flight/freeze response 3) identify anxiety inducing thoughts 4) use relaxation strategies (deep breathing, visualization, cognitive cueing, muscle relaxation)     Anticipated Frequency of Visits: Weekly to every other week Anticipated Length of Treatment Episode: 3-6 months  Treatment Intervention: Cognitive Behavioral therapy  Response to Treatment: Positive  Medical Necessity: Improved patient condition  Plan: CBT  Deronte Solis. MARK 08/26/2016

## 2016-09-01 ENCOUNTER — Ambulatory Visit: Payer: 59 | Admitting: Psychologist

## 2016-09-01 ENCOUNTER — Telehealth: Payer: Self-pay

## 2016-09-01 ENCOUNTER — Telehealth: Payer: Self-pay | Admitting: Cardiovascular Disease

## 2016-09-01 NOTE — Telephone Encounter (Signed)
Recommendations given to caller, who voiced understanding and thanks. 

## 2016-09-01 NOTE — Telephone Encounter (Signed)
Would recommend avoiding any products containing decongestants due to her high heart rate. Would also avoid Benedryl with Corlanor. Claritin or Zyrtec should be ok with cardiac medications.

## 2016-09-01 NOTE — Telephone Encounter (Signed)
New message    Pt mother is calling because pt has a cold but she wants to know what medications dr Wallace Going[place] think is okay for pt to take over the counter

## 2016-09-01 NOTE — Telephone Encounter (Signed)
New message    Patient mom calling wants to know what medication her daughter can take over the counter for cold.

## 2016-09-01 NOTE — Telephone Encounter (Signed)
Mom called to let us know that the patient is home sick and cannot make today's apt. They already have an apt scheduled for next week and the week after. jd

## 2016-09-01 NOTE — Telephone Encounter (Signed)
Patient w history of sinus tach, on metoprolol and corlanor. Any special indications for OTC cold meds?

## 2016-09-08 ENCOUNTER — Other Ambulatory Visit: Payer: Self-pay | Admitting: Family

## 2016-09-08 ENCOUNTER — Ambulatory Visit (INDEPENDENT_AMBULATORY_CARE_PROVIDER_SITE_OTHER): Payer: 59 | Admitting: Psychologist

## 2016-09-08 ENCOUNTER — Encounter: Payer: Self-pay | Admitting: Psychologist

## 2016-09-08 DIAGNOSIS — F411 Generalized anxiety disorder: Secondary | ICD-10-CM | POA: Diagnosis not present

## 2016-09-08 DIAGNOSIS — F902 Attention-deficit hyperactivity disorder, combined type: Secondary | ICD-10-CM

## 2016-09-08 NOTE — Telephone Encounter (Signed)
Mom called for refill for Vyvanse.  Patient last seen 04/03/16, next appointment 09/21/16.

## 2016-09-08 NOTE — Progress Notes (Signed)
  [place] [facility] [place] [facility] [address], [address] [place] [address] Dept: [phone] Dept Fax: [phone] Loc: [phone] Loc Fax: [phone]  Psychology Therapy Session Progress Note  Patient ID: Kristine Edwards, female  DOB: January 09, 1996, 20 y.o.  MRN: [id number]  09/08/2016 Start time: 8 AM End time: 8:50 AM  Present: patient  Service provided: [id number] Individual Psychotherapy (45 min.)  Current Concerns: Anxiety, motivation, medical issues including kyphosis and heart issues  Current Symptoms: Anxiety, Attention problem and Family Stress  Mental Status: Appearance: Well Groomed Attention: good  Motor Behavior: Normal Affect: Full Range Mood: anxious Thought Process: normal Thought Content: normal Suicidal Ideation: None Homicidal Ideation:None Orientation: time, place and person Insight: Fair Judgement: Fair  Diagnosis: Anxiety disorder  Long Term Treatment Goals:  1) decrease anxiety 2) resist flight/freeze response 3) identify anxiety inducing thoughts 4) use relaxation strategies (deep breathing, visualization, cognitive cueing, muscle relaxation)   Increase physical exercise per cardiac consult. [name] to begin to apply to colleges [employer]), to apply for part-time jobs.  Online review of her SAT scores indicate that she received a 600 on the critical reading and a 610 on the math.  Anticipated Frequency of Visits: Every other week Anticipated Length of Treatment Episode: 6 months  Treatment Intervention: Cognitive Behavioral therapy  Response to Treatment: Neutral  Medical Necessity: Improved patient condition  Plan: CBT  [name]. MARK 09/08/2016

## 2016-09-09 MED ORDER — VYVANSE 40 MG PO CAPS: 80.0000 mg | ORAL_CAPSULE | Freq: Every day | ORAL | 0 refills | Status: DC

## 2016-09-09 NOTE — Telephone Encounter (Signed)
Printed Rx and placed at front desk for pick-up  

## 2016-09-16 ENCOUNTER — Ambulatory Visit: Payer: 59 | Admitting: Psychologist

## 2016-09-17 ENCOUNTER — Encounter: Payer: Self-pay | Admitting: Psychologist

## 2016-09-17 ENCOUNTER — Ambulatory Visit (INDEPENDENT_AMBULATORY_CARE_PROVIDER_SITE_OTHER): Payer: 59 | Admitting: Psychologist

## 2016-09-17 DIAGNOSIS — F411 Generalized anxiety disorder: Secondary | ICD-10-CM

## 2016-09-17 NOTE — Progress Notes (Signed)
   [facility]OLOG[facility] [place] [facility] [address], [address] [place] [address] Dept: [phone] Dept Fax: [phone] Loc: [phone] Loc Fax: [phone]  Psychology Therapy Session Progress Note  Patient ID: Kristine LikesCaelin Sara Mcdanel, female  DOB: Apr 10, 1996, 20 y.o.  MRN: [id number]  09/17/2016 Start time: 8 AM End time: 8:50 AM  Present: patient  Service provided: [id number] Individual Psychotherapy (45 min.)  Current Concerns: Anxiety, social isolation, medical issues that necessitate an increase in exercise and activity  Current Symptoms: Anxiety  Mental Status: Appearance: Well Groomed Attention: good  Motor Behavior: Normal Affect: Full Range Mood: anxious Thought Process: normal Thought Content: normal Suicidal Ideation: None Homicidal Ideation:None Orientation: time, place and person Insight: Fair Judgement: Fair  Diagnosis: Generalized anxiety disorder  Long Term Treatment Goals:  1) decrease anxiety 2) resist flight/freeze response 3) identify anxiety inducing thoughts 4) use relaxation strategies (deep breathing, visualization, cognitive cueing, muscle relaxation)  Patient to consider water aerobics, patient to complete applications to [employer], patient to pursue driver's license   Anticipated Frequency of Visits: Weekly to every other week Anticipated Length of Treatment Episode: 3-6 months  Treatment Intervention: Cognitive Behavioral therapy  Response to Treatment: Neutral  Medical Necessity: Improved patient condition  Plan: CBT  LEWIS,R. MARK 09/17/2016

## 2016-09-21 ENCOUNTER — Ambulatory Visit (INDEPENDENT_AMBULATORY_CARE_PROVIDER_SITE_OTHER): Payer: 59 | Admitting: Family

## 2016-09-21 ENCOUNTER — Encounter: Payer: Self-pay | Admitting: Family

## 2016-09-21 VITALS — BP 98/64 | HR 104 | Resp 18 | Ht 61.5 in | Wt 116.8 lb

## 2016-09-21 DIAGNOSIS — F902 Attention-deficit hyperactivity disorder, combined type: Secondary | ICD-10-CM

## 2016-09-21 DIAGNOSIS — F411 Generalized anxiety disorder: Secondary | ICD-10-CM

## 2016-09-21 DIAGNOSIS — R000 Tachycardia, unspecified: Secondary | ICD-10-CM

## 2016-09-21 MED ORDER — VYVANSE 40 MG PO CAPS: 80.0000 mg | ORAL_CAPSULE | Freq: Every day | ORAL | 0 refills | Status: DC

## 2016-09-21 MED ORDER — SERTRALINE HCL 100 MG PO TABS: 100.0000 mg | ORAL_TABLET | Freq: Every day | ORAL | 0 refills | Status: DC

## 2016-09-21 NOTE — Progress Notes (Signed)
[place] [facility] [place] [facility] [address], [address] [place] [address] Dept: [phone] Dept Fax: [phone] Loc: [phone] Loc Fax: [phone]  Medical Follow-up  Patient ID: Kristine Edwards, female  DOB: 01/26/1996, [age] y.o.  MRN: [id number]  Date of Evaluation: 09/21/16  PCP: Tobias AlexanderAMOS, JACK E, MD  Accompanied by: Mother Patient Lives with: parents  HISTORY/CURRENT STATUS:  HPI  Patient here for routine follow up related to ADHD/Anxety and medication management. Patient here with herself in exam room and mother in waiting room during part of the visit. Patient cooperative and interactive at today's follow up visit. [facility].  To continue with current medication regimen with Vyvanse 40 mg 1 daily and Zoloft 100 mg 1 daily without side effects reports.    EDUCATION: School: Graduated from [employer]  Year/Grade: Graduated Homework Time: None now Performance/Grades: above average Services: Tutoring at school when needed.  Activities/Exercise: intermittently-walking started recently with dogs and parents  MEDICAL HISTORY: Appetite: Good MVI/Other: None Fruits/Vegs:Some Calcium: Some Iron:Some  Sleep: Bedtime: 12:30-1:00 am  Awakens: 9:00 am Sleep Concerns: Initiation/Maintenance/Other: Some times has difficulty, but reading in bed.   Individual Medical History/Review of System Changes? Yes, [place] [facility] [place] [facility] [place] [facility], Dr. Griffin BasilJennifer Tanio, and Dr. Kimber RelicPaul Asdourian, orthopedic spine specialist. Allergies: Review of patient's allergies indicates no known allergies.  Current Medications:  Current Outpatient Prescriptions:  .  Adapalene 0.3 % gel, Use as directed, Disp: , Rfl:  .  ivabradine (CORLANOR) 5 MG TABS tablet, Take 1 tablet (5 mg  total) by mouth 2 (two) times daily with a meal., Disp: 60 tablet, Rfl: 6 .  sertraline (ZOLOFT) 100 MG tablet, Take 1 tablet (100 mg total) by mouth daily. Take 1 tab daily. 3 month supply., Disp: 90 tablet, Rfl: 0 .  VYVANSE 40 MG capsule, Take 2 capsules (80 mg total) by mouth daily with breakfast. Take 2 tabs daily, Disp: 60 capsule, Rfl: 0 Medication Side Effects: None  Family Medical/Social History Changes?: No  MENTAL HEALTH: Mental Health Issues: Anxiety-getting better with help. Has seen Dr. Jolene ProvostMark Lewis on a regular basis.   PHYSICAL EXAM: Vitals:  Today's Vitals   09/21/16 0805  Weight: 116 lb 12.8 oz (53 kg)  Height: 5' 1.5" (1.562 m)  PainSc: 0-No pain  , 52 %ile (Z= 0.04) based on CDC 2-20 Years BMI-for-age data using vitals from 09/21/2016.  General Exam: Physical Exam  Constitutional: She is oriented to person, place, and time. She appears well-developed and well-nourished.  HENT:  Head: Normocephalic and atraumatic.  Right Ear: External ear normal.  Left Ear: External ear normal.  Mouth/Throat: Oropharynx is clear and moist.  Eyes: Conjunctivae and EOM are normal. Pupils are equal, round, and reactive to light.  Corrective lenses  Neck: Normal range of motion. Neck supple.  Cardiovascular: Regular rhythm, normal heart sounds and intact distal pulses.  Tachycardia present.   Pulmonary/Chest: Effort normal and breath sounds normal.  Abdominal: Soft. Bowel sounds are normal.  Musculoskeletal: Normal range of motion.  Scoliosis  Neurological: She is alert and oriented to person, place, and time. She has normal reflexes.  Skin: Skin is warm and dry.  Psychiatric: She has a normal mood and affect. Her behavior is normal. Judgment and thought content normal.  Vitals reviewed.  No concerns for toileting. Daily stool, no constipation or diarrhea. Void  urine no difficulty. No enuresis.   Participate in daily oral hygiene to include brushing and  flossing.  Neurological: oriented to time, place, and person Cranial Nerves: normal  Neuromuscular:  Motor Mass: Normal Tone: Normal Strength: Normal DTRs: 2+ and symmetric Overflow: None Reflexes: no tremors noted Sensory Exam: Vibratory: Intact  Fine Touch: Intact  Testing/Developmental Screens:  ASRS-30/20 scored by patient and reviewed.    DIAGNOSES:    ICD-9-CM ICD-10-CM   1. 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 Generalized anxiety disorder 300.02 F41.1   3. Sinus tachycardia 427.89 R00.0     RECOMMENDATIONS: 3 month follow up and continuation with medication as previous. Script given for Vyvanse 40 mg 2 daily, # 60 printed for do not fill until 10/16/16. E[place] sent to [facility] for Zoloft 100 mg 1 daily, # 90 no refills.   Exercise is recommended daily for 20-30 mins/day at least 3-4 days/week. Encouraged slow progress over the next month to increase stamina for exercise tolerance to assist with shortness of breath.   To contact doctor at [facility] to consult with PT for appropriate exercises and/or PT to complete exercises with locally.   NEXT APPOINTMENT: Return in about 3 months (around 12/22/2016) for follow up visit.  More than 50% of the appointment was spent counseling and discussing diagnosis and management of symptoms with the patient and family.  Carron Curie, NP Counseling Time: 30 mins Total Contact Time: 40 mins.

## 2016-09-24 ENCOUNTER — Ambulatory Visit (INDEPENDENT_AMBULATORY_CARE_PROVIDER_SITE_OTHER): Payer: 59 | Admitting: Psychologist

## 2016-09-24 ENCOUNTER — Encounter: Payer: Self-pay | Admitting: Psychologist

## 2016-09-24 ENCOUNTER — Ambulatory Visit: Payer: 59 | Admitting: Pediatrics

## 2016-09-24 DIAGNOSIS — F411 Generalized anxiety disorder: Secondary | ICD-10-CM | POA: Diagnosis not present

## 2016-09-24 NOTE — Progress Notes (Signed)
  [place] [facility] [place] [facility] [address], [place] [place] [address] Dept: [phone] Dept Fax: [phone] Loc: [phone] Loc Fax: [phone]  Psychology Therapy Session Progress Note  Patient ID: Kristine LikesCaelin Sara Edwards, female  DOB: October 13, 1996, 20 y.o.  MRN: [id number]  09/24/2016 Start time: 2 PM End time: 2:50 PM  Present: patient  Service provided: [id number] Individual Psychotherapy (45 min.)  Current Concerns: Anxiety, social isolation, appropriate use of technology, medical issues related to heart and back  Current Symptoms: Anxiety and Family Stress  Mental Status: Appearance: Well Groomed Attention: good  Motor Behavior: Normal Affect: Full Range Mood: anxious Thought Process: normal Thought Content: normal Suicidal Ideation: None Homicidal Ideation:None Orientation: time, place and person Insight: Fair Judgement: Fair  Diagnosis: Anxiety disorder  Long Term Treatment Goals:  1) decrease anxiety 2) resist flight/freeze response 3) identify anxiety inducing thoughts 4) use relaxation strategies (deep breathing, visualization, cognitive cueing, muscle relaxation)   H[place]ione to increase exercise, walking daily, to complete college applications.  Anticipated Frequency of Visits: Weekly to every other week Anticipated Length of Treatment Episode: 3-6 months  Treatment Intervention: Cognitive Behavioral therapy  Response to Treatment: Positive  Medical Necessity: Improved patient condition  Plan: CBT  LEWIS,R. MARK 09/24/2016

## 2016-10-01 ENCOUNTER — Ambulatory Visit: Payer: 59 | Admitting: Cardiovascular Disease

## 2016-10-02 ENCOUNTER — Ambulatory Visit: Payer: 59 | Admitting: Psychologist

## 2016-10-06 ENCOUNTER — Ambulatory Visit (INDEPENDENT_AMBULATORY_CARE_PROVIDER_SITE_OTHER): Payer: 59 | Admitting: Psychologist

## 2016-10-06 DIAGNOSIS — F411 Generalized anxiety disorder: Secondary | ICD-10-CM

## 2016-10-06 NOTE — Progress Notes (Signed)
   DEVELOPMENTAL AND PSYCHOLOGICAL CENTER [place] [facility] [address], [address] [place] [address] Dept: [phone] Dept Fax: [phone] Loc: [phone] Loc Fax: [phone]  Psychology Therapy Session Progress Note  Patient ID: Kristine LikesCaelin Sara Leckey, female  DOB: Nov 07, 1996, 20 y.o.  MRN: [id number]  10/06/2016 Start time: 8 AM End time: 8:50 AM  Present: patient  Service provided: [id number] Individual Psychotherapy (45 min.)  Current Concerns: Anxiety, motivation  Current Symptoms: Anxiety and Attention problem  Mental Status: Appearance: Well Groomed Attention: good  Motor Behavior: Normal Affect: Full Range Mood: anxious Thought Process: normal Thought Content: normal Suicidal Ideation: None Homicidal Ideation:None Orientation: time, place and person Insight: Fair Judgment: Fair  Diagnosis: Generalized anxiety disorder  Long Term Treatment Goals:  1) decrease anxiety 2) resist flight/freeze response 3) identify anxiety inducing thoughts 4) use relaxation strategies (deep breathing, visualization, cognitive cueing, muscle relaxation)   Karyme to complete college application essay and secure two teacher recommendations before next appointment  Anticipated Frequency of Visits: Every other week Anticipated Length of Treatment Episode: 3-6 months  Treatment Intervention: Cognitive Behavioral therapy  Response to Treatment: Positive  Medical Necessity: Improved patient condition  Plan: CBT  Mychael Soots. MARK 10/06/2016

## 2016-10-07 ENCOUNTER — Ambulatory Visit: Payer: 59 | Admitting: Psychologist

## 2016-10-13 ENCOUNTER — Ambulatory Visit (INDEPENDENT_AMBULATORY_CARE_PROVIDER_SITE_OTHER): Payer: 59 | Admitting: Psychologist

## 2016-10-13 ENCOUNTER — Encounter: Payer: Self-pay | Admitting: Psychologist

## 2016-10-13 DIAGNOSIS — F411 Generalized anxiety disorder: Secondary | ICD-10-CM | POA: Diagnosis not present

## 2016-10-13 NOTE — Progress Notes (Signed)
  [place] [facility] [place] [facility] [address], [address] [place] [address] Dept: [phone] Dept Fax: [phone] Loc: [phone] Loc Fax: [phone]  Psychology Therapy Session Progress Note  Patient ID: Kristine LikesCaelin Sara Mettler, female  DOB: [birth year], 20 y.o.  MRN: [id number]  10/13/2016 Start time: 8 AM End time: 8:50 AM  Present: patient  Service provided: [id number] Individual Psychotherapy (45 min.)  Current Concerns: Anxiety, college applications, appropriate use of computer and Internet  Current Symptoms: Anxiety and Family Stress  Mental Status: Appearance: Well Groomed Attention: good  Motor Behavior: Normal Affect: Full Range Mood: anxious Thought Process: normal Thought Content: normal Suicidal Ideation: None Homicidal Ideation:None Orientation: time, place and person Insight: Fair Judgement: Fair  Diagnosis: Anxiety disorder  Long Term Treatment Goals:  1) decrease anxiety 2) resist flight/freeze response 3) identify anxiety inducing thoughts 4) use relaxation strategies (deep breathing, visualization, cognitive cueing, muscle relaxation)   Reviewed Gloris's rough draft college essay. Kaly to get to high school teachers to write letters of recommendation and to contact them within the next week.  Anticipated Frequency of Visits: Weekly to every other week Anticipated Length of Treatment Episode: 3-6 months  Treatment Intervention: Cognitive Behavioral therapy  Response to Treatment: Positive  Medical Necessity: Improved patient condition  Plan: CBT  Jaydalee Bardwell. MARK 10/13/2016

## 2016-10-20 ENCOUNTER — Encounter: Payer: Self-pay | Admitting: Psychologist

## 2016-10-20 ENCOUNTER — Ambulatory Visit (INDEPENDENT_AMBULATORY_CARE_PROVIDER_SITE_OTHER): Payer: 59 | Admitting: Psychologist

## 2016-10-20 DIAGNOSIS — F411 Generalized anxiety disorder: Secondary | ICD-10-CM

## 2016-10-20 NOTE — Progress Notes (Signed)
  [place] [facility] [place] [facility] [address], [address] [place] [address] Dept: [phone] Dept Fax: [phone] Loc: [phone] Loc Fax: [phone]  Psychology Therapy Session Progress Note  Patient ID: Kristine LikesCaelin Sara Rabadan, female  DOB: [birth year], 20 y.o.  MRN: [id number]  10/20/2016 Start time: 8 AM End time: 8:50 AM  Present: patient  Service provided: [id number] Individual Psychotherapy (45 min.)  Current Concerns: Anxiety, need to increase exercise plan, college applications, screen use  Current Symptoms: Anxiety  Mental Status: Appearance: Well Groomed Attention: good  Motor Behavior: Normal Affect: Full Range Mood: anxious Thought Process: normal Thought Content: normal Suicidal Ideation: None Homicidal Ideation:None Orientation: time, place and person Insight: Fair Judgement: Fair  Diagnosis: Anxiety disorder  Long Term Treatment Goals:  1) decrease anxiety 2) resist flight/freeze response 3) identify anxiety inducing thoughts 4) use relaxation strategies (deep breathing, visualization, cognitive cueing, muscle relaxation)     Anticipated Frequency of Visits: Weekly to every other week Anticipated Length of Treatment Episode: 6 months  Treatment Intervention: Cognitive Behavioral therapy  Response to Treatment: Positive  Medical Necessity: Improved patient condition  Plan: CBT  Lavern Maslow. MARK 10/20/2016 CBT

## 2016-10-27 ENCOUNTER — Encounter: Payer: Self-pay | Admitting: Psychologist

## 2016-10-27 ENCOUNTER — Ambulatory Visit (INDEPENDENT_AMBULATORY_CARE_PROVIDER_SITE_OTHER): Payer: 59 | Admitting: Psychologist

## 2016-10-27 DIAGNOSIS — F411 Generalized anxiety disorder: Secondary | ICD-10-CM | POA: Diagnosis not present

## 2016-10-27 NOTE — Progress Notes (Signed)
  [place] [facility] [place] [facility] [address], [address] [place] [address] Dept: [phone] Dept Fax: [phone] Loc: [phone] Loc Fax: [phone]  Psychology Therapy Session Progress Note  Patient ID: Kristine LikesCaelin Sara Edwards, female  DOB: February 03, 1996, 20 y.o.  MRN: [id number]  10/27/2016 Start time: 8 AM End time: 8:50 AM  Present: patient  Service provided: 90834P Individual Psychotherapy (45 min.)  Current Concerns: Anxiety, social isolation,  Current Symptoms: Anxiety  Mental Status: Appearance: Well Groomed Attention: good  Motor Behavior: Normal Affect: Full Range Mood: anxious Thought Process: normal Thought Content: normal Suicidal Ideation: None Homicidal Ideation:None Orientation: time, place and person Insight: Fair Judgement: Fair  Diagnosis: Anxiety disorder  Long Term Treatment Goals:  1) decrease anxiety 2) resist flight/freeze response 3) identify anxiety inducing thoughts 4) use relaxation strategies (deep breathing, visualization, cognitive cueing, muscle relaxation)   Sigourney to complete college application and essay, increase exercise per MDs recommendation, pursue a job at [employer]  Anticipated Frequency of Visits: Weekly to every other week Anticipated Length of Treatment Episode: 3-6 months  Treatment Intervention: Cognitive Behavioral therapy  Response to Treatment: Positive  Medical Necessity: Improved patient condition  Plan: CBT  LEWIS,R. MARK 10/27/2016

## 2016-11-05 ENCOUNTER — Encounter: Payer: Self-pay | Admitting: Psychologist

## 2016-11-05 ENCOUNTER — Ambulatory Visit (INDEPENDENT_AMBULATORY_CARE_PROVIDER_SITE_OTHER): Payer: 59 | Admitting: Psychologist

## 2016-11-05 DIAGNOSIS — F411 Generalized anxiety disorder: Secondary | ICD-10-CM | POA: Diagnosis not present

## 2016-11-05 NOTE — Progress Notes (Signed)
  [place] [facility] [place] [facility] [address], [address] [place] [address] Dept: [phone] Dept Fax: [phone] Loc: [phone] Loc Fax: [phone]  Psychology Therapy Session Progress Note  Patient ID: Reuben LikesCaelin Sara Engen, female  DOB: 1996/10/04, 20 y.o.  MRN: [id number]  11/05/2016 Start time: 8 AM End time: 8:50 AM  Present: patient  Service provided: [id number] Individual Psychotherapy (45 min.)  Current Concerns: Anxiety, social isolation, college applications  Current Symptoms: Anxiety  Mental Status: Appearance: Well Groomed Attention: good  Motor Behavior: Normal Affect: Full Range Mood: anxious Thought Process: normal Thought Content: normal Suicidal Ideation: None Homicidal Ideation:None Orientation: time, place and person Insight: Fair Judgement: Fair  Diagnosis: Anxiety disorder  Long Term Treatment Goals:  1) decrease anxiety 2) resist flight/freeze response 3) identify anxiety inducing thoughts 4) use relaxation strategies (deep breathing, visualization, cognitive cueing, muscle relaxation)   [place] to complete a resume and college application by next appointment  Anticipated Frequency of Visits: Weekly to every other week Anticipated Length of Treatment Episode: 6 months  Treatment Intervention: Cognitive Behavioral therapy  Response to Treatment: Positive  Medical Necessity: Improved patient condition  Plan: CBT  Laverne Klugh. MARK 11/05/2016

## 2016-11-17 ENCOUNTER — Encounter: Payer: Self-pay | Admitting: Psychologist

## 2016-11-17 ENCOUNTER — Ambulatory Visit (INDEPENDENT_AMBULATORY_CARE_PROVIDER_SITE_OTHER): Payer: 59 | Admitting: Psychologist

## 2016-11-17 DIAGNOSIS — F411 Generalized anxiety disorder: Secondary | ICD-10-CM

## 2016-11-17 NOTE — Progress Notes (Signed)
  [facility] [address], [address] [place] [address] Dept: [phone] Dept Fax: [phone] Loc: [phone] Loc Fax: [phone]  Psychology Therapy Session Progress Note  Patient ID: Kristine Edwards, female  DOB: 1996-11-10, 20 y.o.  MRN: [id number]  11/17/2016 Start time: 8 AM End time: 8:50 AM  Present: patient  Service provided: 90834P Individual Psychotherapy (45 min.)  Current Concerns: Anxiety, college applications, job application  Current Symptoms: Anxiety  Mental Status: Appearance: Well Groomed Attention: good  Motor Behavior: Normal Affect: Full Range Mood: anxious Thought Process: normal Thought Content: normal Suicidal Ideation: None Homicidal Ideation:None Orientation: time, place and person Insight: Fair Judgement: Fair  Diagnosis: Generalized anxiety disorder  Long Term Treatment Goals:  1) decrease anxiety 2) resist flight/freeze response 3) identify anxiety inducing thoughts 4) use relaxation strategies (deep breathing, visualization, cognitive cueing, muscle relaxation)   Kristine Edwards to complete college application, job application and resume this week  Anticipated Frequency of Visits: Weekly to every other week Anticipated Length of Treatment Episode: 6 months  Treatment Intervention: Cognitive Behavioral therapy  Response to Treatment: Positive  Medical Necessity: Improved patient condition  Plan: CBT  Kristine Edwards. MARK 11/17/2016

## 2016-11-23 ENCOUNTER — Other Ambulatory Visit: Payer: Self-pay | Admitting: Family

## 2016-11-23 DIAGNOSIS — F902 Attention-deficit hyperactivity disorder, combined type: Secondary | ICD-10-CM

## 2016-11-23 MED ORDER — VYVANSE 40 MG PO CAPS: 80.0000 mg | ORAL_CAPSULE | Freq: Every day | ORAL | 0 refills | Status: DC

## 2016-11-23 NOTE — Telephone Encounter (Signed)
Mom called in for a refill request for VYVANSE 40  Mg .Patient has appointment on 12/10/16 @8am .

## 2016-11-23 NOTE — Telephone Encounter (Signed)
Printed Rx for Vyvanse 40 2 daily #60 and placed at front desk for pick-up

## 2016-11-24 ENCOUNTER — Ambulatory Visit (INDEPENDENT_AMBULATORY_CARE_PROVIDER_SITE_OTHER): Payer: 59 | Admitting: Psychologist

## 2016-11-24 ENCOUNTER — Encounter: Payer: Self-pay | Admitting: Psychologist

## 2016-11-24 DIAGNOSIS — F411 Generalized anxiety disorder: Secondary | ICD-10-CM

## 2016-11-24 NOTE — Progress Notes (Signed)
  [facility] [address], [address] [place] [address] Dept: [phone] Dept Fax: [phone] Loc: [phone] Loc Fax: [phone]  Psychology Therapy Session Progress Note  Patient ID: Kristine Edwards, female  DOB: 1996-08-03, 20 y.o.  MRN: [id number]  11/24/2016 Start time: 8 AM End time: 8:50 AM  Present: patient  Service provided: [id number] Individual Psychotherapy (45 min.)  Current Concerns: Anxiety, Back pain, weak executive functioning making college up occasions difficult  Current Symptoms: Anxiety  Mental Status: Appearance: Well Groomed Attention: good  Motor Behavior: Normal Affect: Full Range Mood: anxious Thought Process: normal Thought Content: normal Suicidal Ideation: None Homicidal Ideation:None Orientation: time, place and person Insight: Fair Judgement: Fair  Diagnosis: Generalized anxiety disorder  Long Term Treatment Goals:  1) decrease anxiety 2) resist flight/freeze response 3) identify anxiety inducing thoughts 4) use relaxation strategies (deep breathing, visualization, cognitive cueing, muscle relaxation)   Made list of college application deadlines. Patient to complete applications by February 1. Patient to complete resume her potential part-time job.  Anticipated Frequency of Visits: Weekly to every other week Anticipated Length of Treatment Episode: 6 months  Treatment Intervention: Cognitive Behavioral therapy  Response to Treatment: Positive  Medical Necessity: Improved patient condition  Plan: CBT  LEWIS,R. MARK 11/24/2016

## 2016-12-10 ENCOUNTER — Ambulatory Visit (INDEPENDENT_AMBULATORY_CARE_PROVIDER_SITE_OTHER): Payer: 59 | Admitting: Family

## 2016-12-10 ENCOUNTER — Encounter: Payer: Self-pay | Admitting: Family

## 2016-12-10 VITALS — BP 112/64 | HR 98 | Resp 18 | Ht 61.0 in | Wt 116.8 lb

## 2016-12-10 DIAGNOSIS — F419 Anxiety disorder, unspecified: Secondary | ICD-10-CM | POA: Diagnosis not present

## 2016-12-10 DIAGNOSIS — F411 Generalized anxiety disorder: Secondary | ICD-10-CM

## 2016-12-10 DIAGNOSIS — F900 Attention-deficit hyperactivity disorder, predominantly inattentive type: Secondary | ICD-10-CM

## 2016-12-10 DIAGNOSIS — R000 Tachycardia, unspecified: Secondary | ICD-10-CM | POA: Diagnosis not present

## 2016-12-10 MED ORDER — SERTRALINE HCL 100 MG PO TABS: 100.0000 mg | ORAL_TABLET | Freq: Every day | ORAL | 0 refills | Status: DC

## 2016-12-10 NOTE — Progress Notes (Signed)
[facility] [address], [address] [place] [address] Dept: [phone] Dept Fax: [phone] Loc: [phone] Loc Fax: [phone]  Medical Follow-up  Patient ID: Kristine Edwards, female  DOB: August 08, 1996, 21 y.o.  MRN: [id number]  Date of Evaluation: 12/10/16  PCP: Tobias AlexanderAMOS, JACK E, MD  Accompanied by: Self Patient Lives with: parents  HISTORY/CURRENT STATUS:  HPI  Patient here for routine follow up related to ADHD/Anxiety and medication management.  Patient here by herself for today's follow up visit. Patient has continued on current medication regimen without any side effects reported.  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   EDUCATION: School: [employer] and applying to college for the Fall  Activities/Exercise: rarely  MEDICAL HISTORY: Appetite: Good MVI/Other: None Fruits/Vegs:Some Calcium: Some Iron:Some  Sleep: Bedtime: 12-1:00 am   Awakens: 9:00 am Sleep Concerns: Initiation/Maintenance/Other: Taking naps more frequently with Anemia.  Individual Medical History/Review of System Changes? Yes, has seen PCP for updated vaccines and is on Fe for Anemia. To follow up with Cardiology routinely for medication management. Dermatology follow up for acne management.   Allergies: Patient has no known allergies.  Current Medications:  Current Outpatient Prescriptions:  .  Adapalene 0.3 % gel, Use as directed, Disp: , Rfl:  .  ivabradine (CORLANOR) 5 MG TABS tablet, Take 1 tablet (5 mg total) by mouth 2 (two) times daily with a meal., Disp: 60 tablet, Rfl: 6 .  sertraline (ZOLOFT) 100 MG tablet, Take 1 tablet (100 mg total) by mouth daily. Take 1 tab daily. 3 month supply., Disp: 90 tablet, Rfl: 0 .  VYVANSE 40 MG capsule, Take 2 capsules (80 mg total) by mouth daily with breakfast., Disp: 60  capsule, Rfl: 0 Medication Side Effects: None  Family Medical/Social History Changes?: No  MENTAL HEALTH: Mental Health Issues: Anxiety-routine follow up with Dr. Melvyn NethLewis for counseling.   PHYSICAL EXAM: Vitals:  Today's Vitals   12/10/16 0823  BP: 112/64  Pulse: 98  Weight: 116 lb 12.8 oz (53 kg)  Height: 5\' 1"  (1.549 m)  , 55 %ile (Z= 0.13) based on CDC 2-20 Years BMI-for-age data using vitals from 12/10/2016.  General Exam: Physical Exam  Constitutional: She is oriented to person, place, and time. She appears well-developed and well-nourished.  HENT:  Head: Normocephalic and atraumatic.  Right Ear: External ear normal.  Left Ear: External ear normal.  Mouth/Throat: Oropharynx is clear and moist.  Eyes: Conjunctivae and EOM are normal. Pupils are equal, round, and reactive to light.  Corrective lenses  Neck: Normal range of motion. Neck supple.  Cardiovascular: Normal rate, regular rhythm, normal heart sounds and intact distal pulses.   Tachycardia  Pulmonary/Chest: Effort normal and breath sounds normal.  Abdominal: Soft. Bowel sounds are normal.  Musculoskeletal: Normal range of motion.  Scoliosis  Neurological: She is alert and oriented to person, place, and time. She has normal reflexes.  Skin: Skin is warm and dry. Capillary refill takes less than 2 seconds.  Psychiatric: She has a normal mood and affect. Her behavior is normal. Judgment and thought content normal.  Vitals reviewed.  No concerns for toileting. Daily stool, no constipation or diarrhea. Void urine no difficulty. No enuresis.   Participate in daily oral hygiene to include brushing and flossing.  Neurological: oriented to time, place, and person Cranial Nerves: normal  Neuromuscular:  Motor Mass: Normal Tone: Normal  Strength:  Normal DTRs: 2+ and symmetric Overflow: None Reflexes: no tremors noted Sensory Exam: Vibratory: Intact  Fine Touch: Intact  Testing/Developmental Screens: ASRS- 31/20 off  medications score per patient.    DIAGNOSES:    ICD-9-CM ICD-10-CM   1. Attention deficit hyperactivity disorder (ADHD), predominantly inattentive type 314.00 F90.0   2. Anxiety 300.00 F41.9   3. Inappropriate sinus tachycardia 427.89 R00.0   4. Generalized anxiety disorder 300.02 F41.1 sertraline (ZOLOFT) 100 MG tablet    RECOMMENDATIONS: 3 month follow up and continuation of medication. To continue with Vvanse 40 mg 1-2 daily, no refill. Zoloft 100 mg 1 daily, # 90 escribed to [facility].   Encouraged patient to continue with physical therapy exercises daily to strength back and abdominal muscles.   Continuation of daily oral hygiene to include flossing and brushing daily, using antimicrobial toothpaste, as well as routine dental exams and twice yearly cleaning.  Recommend supplementation with a multivitamin and omega-3 fatty acids daily.  Maintain adequate intake of Calcium and Vitamin D.  NEXT APPOINTMENT: Return in about 3 months (around 03/10/2017) for follow up visit.  More than 50% of the appointment was spent counseling and discussing diagnosis and management of symptoms with the patient and family.  Carron Curie, NP Counseling Time: 30 mins Total Contact Time: 40 mins

## 2016-12-16 ENCOUNTER — Ambulatory Visit: Payer: 59 | Admitting: Psychologist

## 2016-12-23 ENCOUNTER — Ambulatory Visit: Payer: 59 | Admitting: Psychologist

## 2016-12-31 ENCOUNTER — Encounter: Payer: Self-pay | Admitting: Psychologist

## 2016-12-31 ENCOUNTER — Ambulatory Visit (INDEPENDENT_AMBULATORY_CARE_PROVIDER_SITE_OTHER): Payer: 59 | Admitting: Psychologist

## 2016-12-31 DIAGNOSIS — F411 Generalized anxiety disorder: Secondary | ICD-10-CM | POA: Diagnosis not present

## 2016-12-31 NOTE — Progress Notes (Signed)
  [facility] [address], [address] [place] [address] Dept: [phone] Dept Fax: [phone] Loc: [phone] Loc Fax: [phone]  Psychology Therapy Session Progress Note  Patient ID: Kristine Edwards, female  DOB: 05-Feb-1996, 21 y.o.  MRN: [id number]  12/31/2016 Start time: 2 PM End time: 2:50 PM  Present: patient  Service provided: [id number] Individual Psychotherapy (45 min.)  Current Concerns: Anxiety, executive functioning. College applications  Current Symptoms: Anxiety  Mental Status: Appearance: Well Groomed Attention: good  Motor Behavior: Normal Affect: Full Range Mood: anxious Thought Process: normal Thought Content: normal Suicidal Ideation: None Homicidal Ideation:None Orientation: time, place and person Insight: Fair Judgement: Fair  Diagnosis: Anxiety disorder  Long Term Treatment Goals:  1) decrease anxiety 2) resist flight/freeze response 3) identify anxiety inducing thoughts 4) use relaxation strategies (deep breathing, visualization, cognitive cueing, muscle relaxation)   [name] to increase exercises, to complete physical therapy instructions, and to finalize application to [employer]  Anticipated Frequency of Visits: Every other week Anticipated Length of Treatment Episode: 3 months  Treatment Intervention: Cognitive Behavioral therapy  Response to Treatment: Positive  Medical Necessity: Improved patient condition  Plan: CBT  LEWIS,R. MARK 12/31/2016

## 2017-01-08 ENCOUNTER — Ambulatory Visit (INDEPENDENT_AMBULATORY_CARE_PROVIDER_SITE_OTHER): Payer: 59 | Admitting: Cardiovascular Disease

## 2017-01-08 ENCOUNTER — Other Ambulatory Visit: Payer: Self-pay | Admitting: Family

## 2017-01-08 ENCOUNTER — Encounter: Payer: Self-pay | Admitting: Cardiovascular Disease

## 2017-01-08 VITALS — BP 100/82 | HR 108 | Ht 61.0 in | Wt 115.1 lb

## 2017-01-08 DIAGNOSIS — F902 Attention-deficit hyperactivity disorder, combined type: Secondary | ICD-10-CM

## 2017-01-08 DIAGNOSIS — R000 Tachycardia, unspecified: Secondary | ICD-10-CM

## 2017-01-08 MED ORDER — VYVANSE 40 MG PO CAPS: 80.0000 mg | ORAL_CAPSULE | Freq: Every day | ORAL | 0 refills | Status: DC

## 2017-01-08 MED ORDER — IVABRADINE HCL 7.5 MG PO TABS
7.5000 mg | ORAL_TABLET | Freq: Two times a day (BID) | ORAL | 5 refills | Status: DC
Start: 2017-01-08 — End: 2017-10-29

## 2017-01-08 NOTE — Telephone Encounter (Signed)
Mom called for refill for Vyvanse.  Patient last seen 12/10/16.  Left message for mom to call and schedule follow-up.

## 2017-01-08 NOTE — Patient Instructions (Signed)
Medication Instructions:  INCREASE YOUR CORLANOR TO 7.5 MG TWICE A DAY  Labwork: NONE  Testing/Procedures: NONE  Follow-Up: Your physician recommends that you schedule a follow-up appointment in: 3 MONTH OV  If you need a refill on your cardiac medications before your next appointment, please call your pharmacy.

## 2017-01-08 NOTE — Progress Notes (Signed)
Cardiology Office Note   Date:  01/08/2017   ID:  [name], DOB 03-18-1996, MRN [id number]  PCP:  Tobias Alexander, MD  Cardiologist:   Chilton Si, MD   Chief Complaint  Patient presents with  . Follow-up    no complaints     Patient ID: Kristine Edwards is a 21 y.o. female with inappropriate sinus tachycardia who presents for follow-up.  History of Present Illness: Kristine Edwards is a pleasant, 21 year old woman with inappropriate sinus tachycardia and ADHD who presents for follow-up.  She initially was noted to have inappropriate heart rates in December 2016 when she presented to the emergency department with a fractured elbow.  At that time her heart rate was ranging from 115 to 128.  Her heart rate has been elevated since at least middle school and she's noted exercise intolerance.   Kristine Edwards was evaluated by a pediatric cardiologist in 2014. She was tachycardic at the time and underwent 2 Holter monitors. The first was while taking Vyvanse and the second occurred two days later after holding the medication.  Reportedly, there was no change in her tachycardia.  The ambulatory monitoring and echocardiogram were reportedly unremarkable other than tachycardia and a patent foramen ovale.  Kristine Edwards does not drink any caffeine. She also denies over-the-counter decongestants or nonprescription drugs.  She takes sertraline for anxiety but denies feeling anxious recently.    In January 2017 she had an echo that revealed LVEF 60-65% and no other significant abnormalities.  Ms. Pointer had lab work that revealed normal kidney function, thyroid function and electrolytes.  She was started on metorpolol but there was no improvement in her symptoms.  She was started on ivabradine and it was increased to 5 mg bid. She started taking iron a few months ago.  She thinks that her heart rate has been better-controlled.  At times her heart rate has been as low as 69. It is typically in the 80s when  she checks it at rest.  She still has shortness of breath with exertion but it has improved.  She has been doing physical therapy for kyphosis.  She thinks that her energy levels have improved since starting iron therapy.  She thinks that her hemoglobin was around 9 when checked by her PCP recently.   Past Medical History:  Diagnosis Date  . ADD (attention deficit disorder) 12/17/2015  . ADHD (attention deficit hyperactivity disorder)   . Anxiety 12/17/2015  . Inappropriate sinus tachycardia 02/17/2016  . Shortness of breath 12/17/2015  . Sinus tachycardia 12/17/2015    Past Surgical History:  Procedure Laterality Date  . ADENOIDECTOMY    . WISDOM TOOTH EXTRACTION       Current Outpatient Prescriptions  Medication Sig Dispense Refill  . Adapalene 0.3 % gel Use as directed    . IRON PO Take 1 tablet by mouth daily.    . ivabradine (CORLANOR) 7.5 MG TABS tablet Take 1 tablet (7.5 mg total) by mouth 2 (two) times daily with a meal. 60 tablet 5  . sertraline (ZOLOFT) 100 MG tablet Take 1 tablet (100 mg total) by mouth daily. Take 1 tab daily. 3 month supply. 90 tablet 0  . VYVANSE 40 MG capsule Take 2 capsules (80 mg total) by mouth daily with breakfast. 60 capsule 0   No current facility-administered medications for this visit.     Allergies:   Patient has no known allergies.    Social History:  The patient  reports that she has never smoked. She has never used smokeless tobacco.   Family History:  The patient's family history includes Cancer in her maternal grandfather; Heart disease in her maternal grandfather; Other in her father; Seizures in her maternal grandmother; Stroke in her paternal grandfather.    ROS:  Please see the history of present illness.   Otherwise, review of systems are positive for none.   All other systems are reviewed and negative.    PHYSICAL EXAM: VS:  BP 100/82   Pulse (!) 108   Ht 5\' 1"  (1.549 m)   Wt 52.2 kg (115 lb 2 oz)   BMI 21.75 kg/m  , BMI  Body mass index is 21.75 kg/m. GENERAL:  Well appearing.  Anxious HEENT:  Pupils equal round and reactive, fundi not visualized, oral mucosa unremarkable NECK:  No jugular venous distention, waveform within normal limits, carotid upstroke brisk and symmetric, no bruits LYMPHATICS:  No cervical adenopathy LUNGS:  Clear to auscultation bilaterally HEART:  Tachycardic.  Regular rhythm. PMI not displaced or sustained,S1 and S2 within normal limits, no S3, no S4, no clicks, no rubs, no murmurs ABD:  Flat, positive bowel sounds normal in frequency in pitch, no bruits, no rebound, no guarding, no midline pulsatile mass, no hepatomegaly, no splenomegaly EXT:  2 plus pulses throughout, no edema, no cyanosis no clubbing SKIN:  No rashes no nodules NEURO:  Cranial nerves II through XII grossly intact, motor grossly intact throughout PSYCH:  Cognitively intact, oriented to person place and time   EKG:  EKG is ordered today. 05/19/16: Sinus tachycardia rate 118 bpm. 01/08/17: Sinus tachycardia. Rate 10 8/m.   Recent Labs: No results found for requested labs within last 8760 hours.   11/16/16: WBC 7.4 Hemoglobin 10.5, hematocrit 32.6, platelets 386   Lipid Panel No results found for: CHOL, TRIG, HDL, CHOLHDL, VLDL, LDLCALC, LDLDIRECT    Wt Readings from Last 3 Encounters:  01/08/17 52.2 kg (115 lb 2 oz) (25 %, Z= -0.67)*  12/10/16 53 kg (116 lb 12.8 oz) (29 %, Z= -0.56)*  09/21/16 53 kg (116 lb 12.8 oz) (30 %, Z= -0.53)*   * Growth percentiles are based on CDC 2-20 Years data.      ASSESSMENT AND PLAN:  # Inappropriate sinus tachycardia: Kristine Edwards's hart rates are still elevated in the office but she reports that it has been better and she is feeling well at home. We will increase her ivabradine to 7.5mg .  She still misses her evening dose frequently and was encouraged to take it with dinner.    I suspect that anxiety may be contributing, though she doesn't report feeling anxious.  She is  currently being treated for anemia.  Echo did not reveal any structural abnormalities.  Symptoms were not any different when she stopped Vyvanse.   Current medicines are reviewed at length with the patient today.  The patient does not have concerns regarding medicines.  The following changes have been made:  Increase Ivabradine to 7.5 mg bid.  Labs/ tests ordered today include:   Orders Placed This Encounter  Procedures  . EKG 12-Lead   Time spent: 20 minutes-Greater than 50% of this time was spent in counseling, explanation of diagnosis, planning of further management, and coordination of care.   Disposition:   FU with Yamili Lichtenwalner C. Duke Salvia, MD, [facility] in 3 months   This note was written with the assistance of speech recognition software.  Please excuse any transcriptional errors.  Signed, Londyn Wotton C. Duke Salvia,  MD, [facility]  01/08/2017 5:27 PM    [facility]

## 2017-01-08 NOTE — Telephone Encounter (Signed)
Printed Rx for Vyvanse 40 mg BID and placed at front desk for pick-up

## 2017-01-21 ENCOUNTER — Ambulatory Visit (INDEPENDENT_AMBULATORY_CARE_PROVIDER_SITE_OTHER): Payer: 59 | Admitting: Psychologist

## 2017-01-21 ENCOUNTER — Encounter: Payer: Self-pay | Admitting: Psychologist

## 2017-01-21 DIAGNOSIS — F411 Generalized anxiety disorder: Secondary | ICD-10-CM

## 2017-01-21 NOTE — Progress Notes (Signed)
  [facility] [address], [address] [place] [address] Dept: [phone] Dept Fax: [phone] Loc: [phone] Loc Fax: [phone]  Psychology Therapy Session Progress Note  Patient ID: Kristine Edwards, female  DOB: [birth year], 21 y.o.  MRN: [id number]  01/21/2017 Start time: 8 AM End time: 8:50 AM  Present: patient  Service provided: 90834P Individual Psychotherapy (45 min.)  Current Concerns: Anxiety, did not get accepted to [employer], job versus community college  Current Symptoms: Anxiety and Attention problem  Mental Status: Appearance: Well Groomed Attention: good  Motor Behavior: Normal Affect: Full Range Mood: anxious Thought Process: normal Thought Content: normal Suicidal Ideation: None Homicidal Ideation:None Orientation: time, place and person Insight: Fair Judgement: Fair  Diagnosis: Anxiety disorder  Long Term Treatment Goals:  1) decrease anxiety 2) resist flight/freeze response 3) identify anxiety inducing thoughts 4) use relaxation strategies (deep breathing, visualization, cognitive cueing, muscle relaxation)   Discussed [employer] versus job. Gave patient handouts on [employer]'s college transfer programs classes and schedules  Anticipated Frequency of Visits: Every other week Anticipated Length of Treatment Episode: 3-6 months  Treatment Intervention: Cognitive Behavioral therapy  Response to Treatment: Neutral  Medical Necessity: Improved patient condition  Plan: CBT  LEWIS,R. MARK 01/21/2017

## 2017-01-22 ENCOUNTER — Other Ambulatory Visit: Payer: Self-pay | Admitting: *Deleted

## 2017-02-03 DIAGNOSIS — D509 Iron deficiency anemia, unspecified: Secondary | ICD-10-CM | POA: Diagnosis not present

## 2017-02-03 DIAGNOSIS — Z1321 Encounter for screening for nutritional disorder: Secondary | ICD-10-CM | POA: Diagnosis not present

## 2017-02-09 ENCOUNTER — Encounter: Payer: Self-pay | Admitting: Psychologist

## 2017-02-09 ENCOUNTER — Ambulatory Visit (INDEPENDENT_AMBULATORY_CARE_PROVIDER_SITE_OTHER): Payer: 59 | Admitting: Psychologist

## 2017-02-09 DIAGNOSIS — F411 Generalized anxiety disorder: Secondary | ICD-10-CM | POA: Diagnosis not present

## 2017-02-09 NOTE — Progress Notes (Signed)
  [facility] [address], [address] [place] [address] Dept: [phone] Dept Fax: [phone] Loc: [phone] Loc Fax: [phone]  Psychology Therapy Session Progress Note  Patient ID: Kristine LikesCaelin Sara Percle, female  DOB: 01-26-1996, 21 y.o.  MRN: [id number]  02/09/2017 Start time: 2 PM End time: 2:50 PM  Present: patient  Service provided: 90834P Individual Psychotherapy (45 min.)  Current Concerns: Anxiety slightly improved, school choice: Looking into [employer] or [employer]. Low iron and low vitamin D which is increased fatigue level  Current Symptoms: Anxiety  Mental Status: Appearance: Well Groomed Attention: good  Motor Behavior: Normal Affect: Full Range Mood: anxious Thought Process: normal Thought Content: normal Suicidal Ideation: None Homicidal Ideation:None Orientation: time, place and person Insight: Fair Judgement: Fair  Diagnosis: Generalized anxiety disorder  Long Term Treatment Goals:  1) decrease anxiety 2) resist flight/freeze response 3) identify anxiety inducing thoughts 4) use relaxation strategies (deep breathing, visualization, cognitive cueing, muscle relaxation)   Karishma to apply to community college and take placement tests in the next 2-4 weeks, to increase physical exercise to one half hour daily, to increase iron rich and vitamin D rich foods  Anticipated Frequency of Visits: Every other week Anticipated Length of Treatment Episode: 3-6 months  Treatment Intervention: Cognitive Behavioral therapy  Response to Treatment: Positive  Medical Necessity: Improved patient condition  Plan: CBT  Gisela Lea. MARK 02/09/2017

## 2017-02-23 ENCOUNTER — Ambulatory Visit: Payer: 59 | Admitting: Psychologist

## 2017-02-26 ENCOUNTER — Ambulatory Visit (INDEPENDENT_AMBULATORY_CARE_PROVIDER_SITE_OTHER): Payer: 59 | Admitting: Psychologist

## 2017-02-26 ENCOUNTER — Encounter: Payer: Self-pay | Admitting: Psychologist

## 2017-02-26 DIAGNOSIS — F411 Generalized anxiety disorder: Secondary | ICD-10-CM | POA: Diagnosis not present

## 2017-02-26 NOTE — Progress Notes (Signed)
  [facility] [facility] [address], [address] [place] [address] Dept: [phone] Dept Fax: [phone] Loc: [phone] Loc Fax: [phone]  Psychology Therapy Session Progress Note  Patient ID: Kristine Edwards, female  DOB: [birth year], 21 y.o.  MRN: [id number]  02/26/2017 Start time: 9 AM End time: 9:50 AM  Present: patient  Service provided: [id number] Individual Psychotherapy (45 min.)  Current Concerns: Anxiety which is mildly improved, school options for next year, [employer]  Current Symptoms: Anxiety  Mental Status: Appearance: Well Groomed Attention: good  Motor Behavior: Normal Affect: Full Range Mood: anxious Thought Process: normal Thought Content: normal Suicidal Ideation: None Homicidal Ideation:None Orientation: time, place and person Insight: Fair Judgement: Fair  Diagnosis: Generalized anxiety disorder  Long Term Treatment Goals:  1) decrease anxiety 2) resist flight/freeze response 3) identify anxiety inducing thoughts 4) use relaxation strategies (deep breathing, visualization, cognitive cueing, muscle relaxation)   Morrissa to complete application process for either [employer] or [employer] to begin classes in the fall of 2018. She is also to start the driver's license process  Anticipated Frequency of Visits: Every other week Anticipated Length of Treatment Episode: 6 months  Treatment Intervention: Cognitive Behavioral therapy  Response to Treatment: Positive  Medical Necessity: Assisted patient to achieve or maintain maximum functional capacity  Plan: CBT  Brylon Brenning. MARK 02/26/2017

## 2017-03-02 ENCOUNTER — Other Ambulatory Visit: Payer: Self-pay | Admitting: Family

## 2017-03-02 DIAGNOSIS — F902 Attention-deficit hyperactivity disorder, combined type: Secondary | ICD-10-CM

## 2017-03-02 NOTE — Telephone Encounter (Signed)
Mom called for refill for Vyvanse.  Patient last seen 12/10/16.  Left message for mom to call and schedule follow-up.

## 2017-03-03 MED ORDER — VYVANSE 40 MG PO CAPS: 80.0000 mg | ORAL_CAPSULE | Freq: Every day | ORAL | 0 refills | Status: DC

## 2017-03-03 NOTE — Telephone Encounter (Signed)
Printed Rx and placed at front desk for pick-up  

## 2017-03-15 ENCOUNTER — Telehealth: Payer: Self-pay | Admitting: Family

## 2017-03-15 DIAGNOSIS — F411 Generalized anxiety disorder: Secondary | ICD-10-CM

## 2017-03-15 NOTE — Telephone Encounter (Signed)
Fax sent from [facility] requesting refill for Sertraline 100 mg.  Patient last seen 12/10/16, next appointment 03/19/17.

## 2017-03-16 ENCOUNTER — Ambulatory Visit: Payer: 59 | Admitting: Psychologist

## 2017-03-16 MED ORDER — SERTRALINE HCL 100 MG PO TABS: 100.0000 mg | ORAL_TABLET | Freq: Every day | ORAL | 0 refills | Status: DC

## 2017-03-16 NOTE — Telephone Encounter (Signed)
RX for Sertraline  e-scribed and sent to pharmacy [facility].  Not faxed.

## 2017-03-19 ENCOUNTER — Ambulatory Visit (INDEPENDENT_AMBULATORY_CARE_PROVIDER_SITE_OTHER): Payer: 59 | Admitting: Family

## 2017-03-19 ENCOUNTER — Encounter: Payer: Self-pay | Admitting: Family

## 2017-03-19 VITALS — BP 102/70 | HR 94 | Resp 16 | Ht 61.0 in | Wt 117.4 lb

## 2017-03-19 DIAGNOSIS — F819 Developmental disorder of scholastic skills, unspecified: Secondary | ICD-10-CM | POA: Diagnosis not present

## 2017-03-19 DIAGNOSIS — F411 Generalized anxiety disorder: Secondary | ICD-10-CM

## 2017-03-19 DIAGNOSIS — Z79899 Other long term (current) drug therapy: Secondary | ICD-10-CM

## 2017-03-19 DIAGNOSIS — I4711 Inappropriate sinus tachycardia, so stated: Secondary | ICD-10-CM

## 2017-03-19 DIAGNOSIS — R000 Tachycardia, unspecified: Secondary | ICD-10-CM | POA: Diagnosis not present

## 2017-03-19 DIAGNOSIS — F902 Attention-deficit hyperactivity disorder, combined type: Secondary | ICD-10-CM | POA: Diagnosis not present

## 2017-03-19 MED ORDER — VYVANSE 40 MG PO CAPS: 80.0000 mg | ORAL_CAPSULE | Freq: Every day | ORAL | 0 refills | Status: DC

## 2017-03-19 NOTE — Progress Notes (Signed)
[place] [facility] [facility] [address] [place]. [address] [place] [address] Dept: [phone] Dept Fax: [phone] Loc: [phone] Loc Fax: [phone]  Medical Follow-up  Patient ID: Kristine Edwards, female  DOB: [birth year], 21 y.o.  MRN: [id number]  Date of Evaluation: 03/19/17  PCP: Tobias Alexander, MD  Accompanied by: Self and father in waiting room Patient Lives with: parents and sister  HISTORY/CURRENT STATUS:  HPI  Patient here for routine follow up related to ADHD and medication management. Patient here for today's visit and father waiting room. Patient applying for colleges for the fall admission to local community colleges and not admitted to [employer]. Patient thinking about working this summer and family may take a vacation. Has continued to take Vyvanse 40 mg 1-2 daily depending on what she is doing and Zoloft 100 mg daily without any side effects reported   EDUCATION: School: [employer] application and not accepted. To apply to [employer] or [employer] for next year. Year/GradeAmada Kingfisher Homework Time: None now Activities/Exercise: intermittently-walking the dogs.   MEDICAL HISTORY: Appetite: Good MVI/Other: Daily Fruits/Vegs:Some Calcium: Some Iron:Some  Sleep: 7-8 hours with naps. 1:30 am to 9:30 am most days.  Sleep Concerns: Initiation/Maintenance/Other: Occasional sleep initiation issue, with napping when goes to sleep late, up reading for a while.   Individual Medical History/Review of System Changes? Yes, had PCP follow up with blood work, Vitamin D deficiency, Fe+ still slightly low, and has to follow up with Dermatology for acne medication.   Allergies: Patient has no known allergies.  Current Medications:  Current Outpatient Prescriptions:  .  Adapalene 0.3 % gel, Use as directed, Disp: , Rfl:  .  IRON PO, Take 1 tablet by mouth daily., Disp: , Rfl:  .   ivabradine (CORLANOR) 7.5 MG TABS tablet, Take 1 tablet (7.5 mg total) by mouth 2 (two) times daily with a meal., Disp: 60 tablet, Rfl: 5 .  sertraline (ZOLOFT) 100 MG tablet, Take 1 tablet (100 mg total) by mouth daily. Take 1 tab daily. 3 month supply., Disp: 90 tablet, Rfl: 0 .  VYVANSE 40 MG capsule, Take 2 capsules (80 mg total) by mouth daily with breakfast., Disp: 60 capsule, Rfl: 0 Medication Side Effects: None  Family Medical/Social History Changes?: Yes, sister graduated from [employer] and living temporarily in [place] with family.   MENTAL HEALTH: Mental Health Issues: Anxiety-patient reports decreased anxiety with no suicidal thoughts or ideations on current Zoloft.   PHYSICAL EXAM: Vitals:  Today's Vitals   03/19/17 1014  BP: 102/70  Pulse: 94  Resp: 16  Weight: 117 lb 6.4 oz (53.3 kg)  Height:  (1.549 m)  PainSc: 0-No pain  , 56 %ile (Z= 0.15) based on CDC 2-20 Years BMI-for-age data using vitals from 03/19/2017.  General Exam: Physical Exam  Constitutional: She is oriented to person, place, and time. She appears well-developed and well-nourished.  HENT:  Head: Normocephalic and atraumatic.  Right Ear: External ear normal.  Left Ear: External ear normal.  Mouth/Throat: Oropharynx is clear and moist.  Eyes: Conjunctivae and EOM are normal. Pupils are equal, round, and reactive to light.  Corrective lenses  Neck: Normal range of motion. Neck supple.  Cardiovascular: Normal rate, regular rhythm, normal heart sounds and intact distal pulses.   Pulmonary/Chest: Effort normal and breath sounds normal.  Abdominal: Soft. Bowel sounds are normal.  Genitourinary:  Genitourinary Comments: Deferred  Musculoskeletal: Normal range of motion.  Neurological:  She is alert and oriented to person, place, and time. She has normal reflexes.  Skin: Skin is warm and dry. Capillary refill takes less than 2 seconds.  Acne  Psychiatric: She has a normal mood and affect. Her behavior is  normal. Judgment and thought content normal.  Vitals reviewed.  Review of Systems  Psychiatric/Behavioral: Positive for decreased concentration and sleep disturbance. The patient is nervous/anxious.   All other systems reviewed and are negative.  No concerns for toileting. Daily stool, no constipation or diarrhea. Void urine no difficulty. No enuresis.   Participate in daily oral hygiene to include brushing and flossing.  Neurological: oriented to time, place, and person Cranial Nerves: normal  Neuromuscular:  Motor Mass: Normal Tone: Normal Strength: Normal DTRs: 2+ and symmetric Overflow: None Reflexes: no tremors noted Sensory Exam: Vibratory: Intact  Fine Touch: Intact  Testing/Developmental Screens:  ASRS-    DIAGNOSES:    ICD-9-CM ICD-10-CM   1. ADHD (attention deficit hyperactivity disorder), combined type 314.01 F90.2 VYVANSE 40 MG capsule  2. Generalized anxiety disorder 300.02 F41.1   3. Inappropriate sinus tachycardia 427.89 R00.0   4. Problems with learning V40.0 F81.9   5. Medication management V58.69 Z79.899     RECOMMENDATIONS: 3 month follow up for medication management. Vyvanse 40 mg daily, 1-2 daily depending on day, # 60 script printed and given to patient today. Zoloft 100 mg escribed 03/16/17 for 3 months supply.   Discussed plans for school enrollment with patient along with applications to [employer] Encouraged basic classes to start since patient is not sure of career plans  To increase physical activities daily with suggestions provided for health maintenance.   Continuation of daily oral hygiene to include flossing and brushing daily, using antimicrobial toothpaste, as well as routine dental exams and twice yearly cleaning.  Recommend supplementation with a multivitamin and omega-3 fatty acids daily.  Maintain adequate intake of Calcium and Vitamin D.  NEXT APPOINTMENT: Return in about 3 months (around 06/18/2017) for follow up  visit.  More than 50% of the appointment was spent counseling and discussing diagnosis and management of symptoms with the patient and family.  Carron Curie, NP Counseling Time: 30 mins Total Contact Time: 40 mins

## 2017-03-30 ENCOUNTER — Encounter: Payer: Self-pay | Admitting: Psychologist

## 2017-03-30 ENCOUNTER — Ambulatory Visit (INDEPENDENT_AMBULATORY_CARE_PROVIDER_SITE_OTHER): Payer: 59 | Admitting: Psychologist

## 2017-03-30 DIAGNOSIS — F411 Generalized anxiety disorder: Secondary | ICD-10-CM

## 2017-03-30 NOTE — Progress Notes (Signed)
  [place] [facility] [place] [facility] [address] [place]. [address] [place] [address] Dept: [phone] Dept Fax: [phone] Loc: [phone] Loc Fax: [phone]  Psychology Therapy Session Progress Note  Patient ID: Kristine Edwards, female  DOB: 1996-05-25, 21 y.o.  MRN: [id number]  03/30/2017 Start time: 2 PM End time: 2:50 PM  Present: patient  Service provided: 53664Q Individual Psychotherapy (45 min.)  Current Concerns: Anxiety which is moderately improved. Executive functioning which appears to be improved. Future plans to include [employer] for the fall semester,  part-time job, increase exercise  Current Symptoms: Anxiety and Organization problem  Mental Status: Appearance: Well Groomed Attention: good  Motor Behavior: Normal Affect: Full Range Mood: anxious Thought Process: normal Thought Content: normal Suicidal Ideation: None Homicidal Ideation:None Orientation: time, place and person Insight: Fair Judgement: Fair  Diagnosis: Generalized anxiety disorder  Long Term Treatment Goals:  1) decrease anxiety 2) resist flight/freeze response 3) identify anxiety inducing thoughts 4) use relaxation strategies (deep breathing, visualization, cognitive cueing, muscle relaxation)   Enroll in [employer] for the fall semester, secure a part-time job  Anticipated Frequency of Visits: Every other week Anticipated Length of Treatment Episode: 3 months  Treatment Intervention: Cognitive Behavioral therapy  Response to Treatment: Positive  Medical Necessity: Assisted patient to achieve or maintain maximum functional capacity  Plan: CBT  Mendy Lapinsky. MARK 03/30/2017

## 2017-04-13 ENCOUNTER — Ambulatory Visit (INDEPENDENT_AMBULATORY_CARE_PROVIDER_SITE_OTHER): Payer: 59 | Admitting: Cardiovascular Disease

## 2017-04-13 ENCOUNTER — Encounter: Payer: Self-pay | Admitting: Cardiovascular Disease

## 2017-04-13 VITALS — BP 118/80 | HR 86 | Ht 61.0 in | Wt 118.0 lb

## 2017-04-13 DIAGNOSIS — R000 Tachycardia, unspecified: Secondary | ICD-10-CM

## 2017-04-13 NOTE — Progress Notes (Signed)
Cardiology Office Note   Date:  04/13/2017   ID:  Kristine LikesCaelin Sara Edwards, DOB [birth year], MRN [id number]  PCP:  Gweneth DimitriMcNeill, Wendy, MD  Cardiologist:   Chilton Siiffany Bruce, MD   Chief Complaint  Patient presents with  . Follow-up    no chest pain, shortness of breath-only when allot of exercise is preformed, no edema, no pain or cramping in legs, occassional lightheaded or dizziness    Patient ID: Kristine Edwards is a 21 y.o. female with inappropriate sinus tachycardia who presents for follow-up.  History of Present Illness: Ms. Kristine Edwards is a pleasant, 21 year old woman with inappropriate sinus tachycardia, PFO, and ADHD who presents for follow-up.  She initially was noted to have inappropriate heart rates in December 2016 when she presented to the emergency department with a fractured elbow.  At that time her heart rate was ranging from 115 to 128.  Her heart rate has been elevated since at least middle school and she's noted exercise intolerance.   Ms. Kristine Edwards was evaluated by a pediatric cardiologist in 2014. She was tachycardic at the time and underwent 2 Holter monitors. The first was while taking Vyvanse and the second occurred two days later after holding the medication.  Reportedly, there was no change in her tachycardia.  The ambulatory monitoring and echocardiogram were reportedly unremarkable other than tachycardia and a patent foramen ovale.    In January 2017 she had an echo that revealed LVEF 60-65% and no other significant abnormalities.  Ms. Kristine Edwards had lab work that revealed normal kidney function, thyroid function and electrolytes.  She was started on metorpolol but there was no improvement in her symptoms.  She was started on ivabradine and it was increased to 7.5 mg Total. She also noted that she was not taking her medication twice daily at that time. Since then she has started taking it more regularly. She denies any palpitations. She started back walking with her mom and working in  the yard for exercise. She has mild shortness of breath with exertion. She denies lower extremity edema, orthopnea, or PND.  Ms. Kristine Edwards is currently taking a gap year. She is in the process of applying to colleges for the fall.    Past Medical History:  Diagnosis Date  . ADD (attention deficit disorder) 12/17/2015  . ADHD (attention deficit hyperactivity disorder)   . Anxiety 12/17/2015  . Inappropriate sinus tachycardia 02/17/2016  . Shortness of breath 12/17/2015  . Sinus tachycardia 12/17/2015    Past Surgical History:  Procedure Laterality Date  . ADENOIDECTOMY    . WISDOM TOOTH EXTRACTION       Current Outpatient Prescriptions  Medication Sig Dispense Refill  . Adapalene 0.3 % gel Use as directed    . IRON PO Take 1 tablet by mouth daily.    . ivabradine (CORLANOR) 7.5 MG TABS tablet Take 1 tablet (7.5 mg total) by mouth 2 (two) times daily with a meal. 60 tablet 5  . sertraline (ZOLOFT) 100 MG tablet Take 1 tablet (100 mg total) by mouth daily. Take 1 tab daily. 3 month supply. 90 tablet 0  . Vitamin D, Ergocalciferol, (DRISDOL) 50000 units CAPS capsule Take 1 capsule by mouth once a week.    [name]. VYVANSE 40 MG capsule Take 2 capsules (80 mg total) by mouth daily with breakfast. 60 capsule 0   No current facility-administered medications for this visit.     Allergies:   Patient has no known allergies.    Social History:  The  patient  reports that she has never smoked. She has never used smokeless tobacco.   Family History:  The patient's family history includes Cancer in her maternal grandfather; Heart disease in her maternal grandfather; Other in her father; Seizures in her maternal grandmother; Stroke in her paternal grandfather.    ROS:  Please see the history of present illness.   Otherwise, review of systems are positive for none.   All other systems are reviewed and negative.    PHYSICAL EXAM: VS:  BP 118/80   Pulse 86   Ht 5\' 1"  (1.549 m)   Wt 53.5 kg (118 lb)    LMP 03/15/2017 (Approximate)   BMI 22.30 kg/m  , BMI Body mass index is 22.3 kg/m. GENERAL:  Well appearing.  No acute distress HEENT:  Pupils equal round and reactive, fundi not visualized, oral mucosa unremarkable NECK:  No jugular venous distention, waveform within normal limits, carotid upstroke brisk and symmetric, no bruits LYMPHATICS:  No cervical adenopathy LUNGS:  Clear to auscultation bilaterally.  No crackles, rhonchi or wheezes HEART:  Tachycardic.  Regular rhythm. PMI not displaced or sustained,S1 and S2 within normal limits, no S3, no S4, no clicks, no rubs, no murmurs ABD:  Flat, positive bowel sounds normal in frequency in pitch, no bruits, no rebound, no guarding, no midline pulsatile mass, no hepatomegaly, no splenomegaly EXT:  2 plus pulses throughout, no edema, no cyanosis no clubbing SKIN:  No rashes no nodules NEURO:  Cranial nerves II through XII grossly intact, motor grossly intact throughout PSYCH:  Cognitively intact, oriented to person place and time   EKG:  EKG is ordered today. 05/19/16: Sinus tachycardia rate 118 bpm. 01/08/17: Sinus tachycardia. Rate 10 8/m.  04/13/17: Sinus rhythm.  Rate 86 bpm.   Recent Labs: No results found for requested labs within last 8760 hours.   11/16/16: WBC 7.4 Hemoglobin 10.5, hematocrit 32.6, platelets 386   Lipid Panel No results found for: CHOL, TRIG, HDL, CHOLHDL, VLDL, LDLCALC, LDLDIRECT    Wt Readings from Last 3 Encounters:  04/13/17 53.5 kg (118 lb) (30 %, Z= -0.52)*  03/19/17 53.3 kg (117 lb 6.4 oz) (29 %, Z= -0.55)*  01/08/17 52.2 kg (115 lb 2 oz) (25 %, Z= -0.67)*   * Growth percentiles are based on CDC 2-20 Years data.      ASSESSMENT AND PLAN:  # Inappropriate sinus tachycardia: Heart rates are much better on the higher dose of ivabradine.  No changes.  Current medicines are reviewed at length with the patient today.  The patient does not have concerns regarding medicines.  The following changes have  been made:  No change  Labs/ tests ordered today include:   No orders of the defined types were placed in this encounter.   Disposition:   FU with Ebert Forrester C. Duke Salvia, MD, [facility] in 1 year   This note was written with the assistance of speech recognition software.  Please excuse any transcriptional errors.  Signed, Raeden Belzer C. Duke Salvia, MD, [facility]  04/13/2017 10:01 AM    [facility]

## 2017-04-13 NOTE — Patient Instructions (Signed)

## 2017-04-14 ENCOUNTER — Ambulatory Visit (INDEPENDENT_AMBULATORY_CARE_PROVIDER_SITE_OTHER): Payer: 59 | Admitting: Psychologist

## 2017-04-14 ENCOUNTER — Encounter: Payer: Self-pay | Admitting: Psychologist

## 2017-04-14 DIAGNOSIS — F411 Generalized anxiety disorder: Secondary | ICD-10-CM | POA: Diagnosis not present

## 2017-04-14 NOTE — Progress Notes (Signed)
  [place] [facility] [place] [facility] [address], [place] [place] [address] Dept: [phone] Dept Fax: [phone] Loc: [phone] Loc Fax: [phone]  Psychology Therapy Session Progress Note  Patient ID: Kristine Edwards, female  DOB: [birth year], 21 y.o.  MRN: [id number]  04/14/2017 Start time: 2 PM End time: 2:50 PM  Present: patient  Service provided: [id number] Individual Psychotherapy (45 min.)  Current Concerns: Anxiety which is moderately improved. Still struggling with follow-through to get job, although she has done some volunteer work. Plans to attend [employer] in the fall and take college transfer classes with ultimate goal transitioning to [employer] at some point. At cardiac appointment yesterday and per her report she is doing well and was discontinued from her quarterly appointments and is now on a yearly follow-up basis with cardiology.  Current Symptoms: Anxiety, Attention problem and Organization problem  Mental Status: Appearance: Well Groomed Attention: good  Motor Behavior: Normal Affect: Full Range Mood: anxious Thought Process: normal Thought Content: normal Suicidal Ideation: None Homicidal Ideation:None Orientation: time, place and person Insight: Fair Judgement: Fair  Diagnosis: Generalized anxiety disorder  Long Term Treatment Goals:  1) decrease anxiety 2) resist flight/freeze response 3) identify anxiety inducing thoughts 4) use relaxation strategies (deep breathing, visualization, cognitive cueing, muscle relaxation)   Increase exercise  Anticipated Frequency of Visits: Every other week to monthly Anticipated Length of Treatment Episode: 6 month  Treatment Intervention: Cognitive Behavioral therapy  Response to Treatment: Positive  Medical Necessity: Assisted patient to achieve or maintain maximum functional capacity  Plan:  CBT  LEWIS,R. MARK 04/14/2017

## 2017-04-21 ENCOUNTER — Other Ambulatory Visit: Payer: Self-pay | Admitting: Family

## 2017-04-21 DIAGNOSIS — F902 Attention-deficit hyperactivity disorder, combined type: Secondary | ICD-10-CM

## 2017-04-21 MED ORDER — VYVANSE 40 MG PO CAPS: 80.0000 mg | ORAL_CAPSULE | Freq: Every day | ORAL | 0 refills | Status: DC

## 2017-04-21 NOTE — Telephone Encounter (Signed)
Printed Rx for Vyvanse 40 mg  and placed at front desk for pick-up  

## 2017-04-21 NOTE — Telephone Encounter (Signed)
Mom called for refill for Vyvanse 40 mg.  Patient last seen 03/19/17, next appointment 06/16/17.

## 2017-06-01 ENCOUNTER — Encounter: Payer: Self-pay | Admitting: Psychologist

## 2017-06-01 ENCOUNTER — Other Ambulatory Visit: Payer: Self-pay | Admitting: Family

## 2017-06-01 ENCOUNTER — Ambulatory Visit (INDEPENDENT_AMBULATORY_CARE_PROVIDER_SITE_OTHER): Payer: 59 | Admitting: Psychologist

## 2017-06-01 DIAGNOSIS — F411 Generalized anxiety disorder: Secondary | ICD-10-CM | POA: Diagnosis not present

## 2017-06-01 DIAGNOSIS — F902 Attention-deficit hyperactivity disorder, combined type: Secondary | ICD-10-CM

## 2017-06-01 MED ORDER — VYVANSE 40 MG PO CAPS: 80.0000 mg | ORAL_CAPSULE | Freq: Every day | ORAL | 0 refills | Status: DC

## 2017-06-01 NOTE — Telephone Encounter (Signed)
Printed Rx and placed at front desk for pick-up  

## 2017-06-01 NOTE — Telephone Encounter (Signed)
Mom called for refill for Vyvanse 40 mg, #60.  Patient last seen 03/19/17, next appointment 06/16/17.

## 2017-06-01 NOTE — Progress Notes (Signed)
  [place] [facility] [place] [facility] [address], [place] [place] [address] Dept: [phone] Dept Fax: [phone] Loc: [phone] Loc Fax: [phone]  Psychology Therapy Session Progress Note  Patient ID: Kristine Edwards, female  DOB: 23-May-1996, 21 y.o.  MRN: [id number]  06/01/2017 Start time: 2 PM End time: 2:50 PM  Present: patient  Service provided: 90834P Individual Psychotherapy (45 min.)  Current Concerns: Anxiety which is mildly to moderately improved, inconsistent executive functioning, low motivation/  Current Symptoms: Anxiety and Attention problem  Mental Status: Appearance: Well Groomed Attention: good  Motor Behavior: Normal Affect: Full Range Mood: anxious Thought Process: normal Thought Content: normal Suicidal Ideation: None Homicidal Ideation:None Orientation: time, place and person Insight: Fair Judgement: Fair  Diagnosis: Generalized anxiety disorder  Long Term Treatment Goals:  1) decrease anxiety 2) resist flight/freeze response 3) identify anxiety inducing thoughts 4) use relaxation strategies (deep breathing, visualization, cognitive cueing, muscle relaxation)   Kristine Edwards to enroll at [employer] this week  Anticipated Frequency of Visits: Every other week Anticipated Length of Treatment Episode: 3 months  Treatment Intervention: Cognitive Behavioral therapy  Response to Treatment: Neutral  Medical Necessity: Improved patient condition  Plan: CBT  Kristine Edwards 06/01/2017

## 2017-06-11 DIAGNOSIS — E559 Vitamin D deficiency, unspecified: Secondary | ICD-10-CM | POA: Diagnosis not present

## 2017-06-11 DIAGNOSIS — D509 Iron deficiency anemia, unspecified: Secondary | ICD-10-CM | POA: Diagnosis not present

## 2017-06-16 ENCOUNTER — Ambulatory Visit (INDEPENDENT_AMBULATORY_CARE_PROVIDER_SITE_OTHER): Payer: 59 | Admitting: Family

## 2017-06-16 ENCOUNTER — Encounter: Payer: Self-pay | Admitting: Family

## 2017-06-16 VITALS — BP 100/68 | HR 76 | Resp 16 | Ht 61.0 in | Wt 119.6 lb

## 2017-06-16 DIAGNOSIS — R000 Tachycardia, unspecified: Secondary | ICD-10-CM

## 2017-06-16 DIAGNOSIS — R278 Other lack of coordination: Secondary | ICD-10-CM

## 2017-06-16 DIAGNOSIS — F411 Generalized anxiety disorder: Secondary | ICD-10-CM | POA: Diagnosis not present

## 2017-06-16 DIAGNOSIS — F902 Attention-deficit hyperactivity disorder, combined type: Secondary | ICD-10-CM

## 2017-06-16 DIAGNOSIS — F419 Anxiety disorder, unspecified: Secondary | ICD-10-CM

## 2017-06-16 DIAGNOSIS — Z79899 Other long term (current) drug therapy: Secondary | ICD-10-CM

## 2017-06-16 MED ORDER — VYVANSE 40 MG PO CAPS: 80.0000 mg | ORAL_CAPSULE | Freq: Every day | ORAL | 0 refills | Status: DC

## 2017-06-16 MED ORDER — SERTRALINE HCL 100 MG PO TABS: 100.0000 mg | ORAL_TABLET | Freq: Every day | ORAL | 0 refills | Status: DC

## 2017-06-16 NOTE — Progress Notes (Signed)
[facility] [address], [place] [place] [address] Dept: [phone] Dept Fax: [phone] Loc: [phone] Loc Fax: [phone]  Medical Follow-up  Patient ID: Kristine Edwards, female  DOB: June 20, 1996, 21 y.o.  MRN: [id number]  Date of Evaluation: 06/16/17  PCP: Gweneth Dimitri, MD  Accompanied by: Self Patient Lives with: parents  HISTORY/CURRENT STATUS:  HPI  Patient here for routine follow up related to ADHD, Anxiety, Learning Problems, and medication management. Patient here by herself for today's visit. Patient talkative and interactive with provider. Discussed enrollment in [employer] for the fall semester and will complete needed information for admissions this week. Has continued with Vyvanse 40 mg daily, Zoloft 100 mg daily and other OTC medications with no reported side effects.   EDUCATION: School: [employer] (Fall) Year/Grade:Freshman  Homework Time: None now Performance/Grades: freshman Services: IEP/504 Plan and Other: Disability Services Activities/Exercise: intermittently  MEDICAL HISTORY: Appetite: Good, trying to eat better MVI/Other: Fe, MVI Daily, Vitamin D Fruits/Vegs:Some Calcium: Some Iron:Some  Sleep: Bedtime: 12:00 am and waking at 3-5:00 am  Awakens: 8-9:00 am Sleep Concerns: Initiation/Maintenance/Other: Napping during the day now due to increased fatigue. Irregular sleep schedule with waking at night and sleeping during the day.   Individual Medical History/Review of System Changes? Yes, had blood work done last week at PCP for anemia, better results. Vitamin D is still low. To schedule with Dermatology for new script related to acne break outs. Follow up with cardiology in May for yearly visit.  Allergies: Patient has no known allergies.  Current Medications:  Current Outpatient Prescriptions:  .  Adapalene 0.3 % gel,  Use as directed, Disp: , Rfl:  .  IRON PO, Take 1 tablet by mouth daily., Disp: , Rfl:  .  ivabradine (CORLANOR) 7.5 MG TABS tablet, Take 1 tablet (7.5 mg total) by mouth 2 (two) times daily with a meal., Disp: 60 tablet, Rfl: 5 .  sertraline (ZOLOFT) 100 MG tablet, Take 1 tablet (100 mg total) by mouth daily. Take 1 tab daily. 3 month supply., Disp: 90 tablet, Rfl: 0 .  Vitamin D, Ergocalciferol, (DRISDOL) 50000 units CAPS capsule, Take 1 capsule by mouth once a week., Disp: , Rfl:  .  VYVANSE 40 MG capsule, Take 2 capsules (80 mg total) by mouth daily with breakfast., Disp: 60 capsule, Rfl: 0 Medication Side Effects: None  Family Medical/Social History Changes?: None reported recently by patient.   MENTAL HEALTH: Mental Health Issues: Anxiety-See Dr. Melvyn Neth for CBT weekly to assist with coping strategies. No reported suicidal ideations or thoughts on Zoloft per patient report.   PHYSICAL EXAM: Vitals:  Today's Vitals   06/16/17 0808  BP: 100/68  Pulse: 76  Resp: 16  Weight: 119 lb 9.6 oz (54.3 kg)  Height: 5\' 1"  (1.549 m)  PainSc: 0-No pain  , 60 %ile (Z= 0.25) based on CDC 2-20 Years BMI-for-age data using vitals from 06/16/2017.  General Exam: Physical Exam  Constitutional: She is oriented to person, place, and time. She appears well-developed and well-nourished.  HENT:  Head: Normocephalic and atraumatic.  Right Ear: External ear normal.  Left Ear: External ear normal.  Mouth/Throat: Oropharynx is clear and moist.  Eyes: Conjunctivae and EOM are normal. Pupils are equal, round, and reactive to light.  Corrective lenses  Neck: Normal range of motion. Neck supple.  Cardiovascular: Normal rate, regular rhythm, normal heart sounds and intact distal pulses.   Pulmonary/Chest: Effort  normal and breath sounds normal.  Abdominal: Soft. Bowel sounds are normal.  Genitourinary:  Genitourinary Comments: Deferred  Musculoskeletal: Normal range of motion.  Neurological: She is alert  and oriented to person, place, and time. She has normal reflexes.  Skin: Skin is warm and dry. Capillary refill takes less than 2 seconds.  Psychiatric: She has a normal mood and affect. Her behavior is normal. Judgment and thought content normal.  Vitals reviewed.  Review of Systems  Psychiatric/Behavioral: Positive for decreased concentration. The patient is nervous/anxious.   All other systems reviewed and are negative.  No concerns for toileting. Not daily stool, but every few days, no constipation or diarrhea. Void urine no difficulty. No enuresis.   Participate in daily oral hygiene to include brushing and flossing.  Neurological: oriented to time, place, and person Cranial Nerves: normal  Neuromuscular:  Motor Mass: Normal Tone: Normal Strength: Normal DTRs: 2+ and symmetric Overflow: None Reflexes: no tremors noted Sensory Exam: Vibratory: Intact  Fine Touch: Intact  Testing/Developmental Screens:  ASRS-23/16 scored by patient and counseled.  DIAGNOSES:    ICD-10-CM   1. Attention deficit hyperactivity disorder (ADHD), combined type F90.2   2. Anxiety F41.9   3. Dysgraphia R27.8   4. 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 Medication management Z79.899   6. Generalized anxiety disorder F41.1 sertraline (ZOLOFT) 100 MG tablet  7. ADHD (attention deficit hyperactivity disorder), combined type F90.2 VYVANSE 40 MG capsule    RECOMMENDATIONS: 3 month follow up and continuation of medication. Counseled on medication adherence. Vyvanse has continued with 40 mg daily only 1 capsule and will increase back to 2 tablets next month with start of school. Script printed for # 60 given to patient today. Zoloft 100 mg daily, # 90 [facility].   Counseled patient on increasing physical activity this summer and suggestions given to patient. Patient was walking and encouraged to increase the time and distance of walking each session. Attempting to give patient other  activities that she will enjoy and will comply with at least 3-4 times/week. Encouarged patient to increase   Recommended eating a healthy diet with increased fruits, vegetables, learn protein, avoiding sugary drinks or snack, and limiting the amount of fast foods. Encouraged to increase the amount of water intake daily, especially with increased temperature.   Advised patient of sleep hygiene with sleep/wake schedule. Patient's sleep schedule is off due to napping during the day. Patient to attempt not napping and will restart Vitamin D to avoid feeling too tired during the day. Use of melatonin recommended to assist will initiation to get enough sleep each night.   Directed patient to follow up with PCP as needed, dentist every 6 months, dermatology as needed for skin care, eye doctor for routine visit, MVI with Omega 3, healthy diet, and increase exercise for health maintenance. 78  NEXT APPOINTMENT: Return in about 3 months (around 09/16/2017) for 3 month follow up .  More than 50% of the appointment was spent counseling and discussing diagnosis and management of symptoms with the patient and family.  Carron Curieawn M Paretta-Leahey, NP Counseling Time: 30 min Total Contact Time: 40 mins

## 2017-07-08 ENCOUNTER — Telehealth: Payer: Self-pay | Admitting: Family

## 2017-07-08 DIAGNOSIS — F902 Attention-deficit hyperactivity disorder, combined type: Secondary | ICD-10-CM

## 2017-07-08 NOTE — Telephone Encounter (Signed)
Left message for mom that 3255-month supply was [facility] on 06/16/[facility].

## 2017-07-08 NOTE — Telephone Encounter (Signed)
Mom called for refill for Sertraline 100 mg.  Please call in to [facility].  Patient last seen 06/16/17.  Left message for mom to call and schedule follow-up.

## 2017-07-08 NOTE — Telephone Encounter (Signed)
Escribed Zoloft 100 mg dose for # 90 to [facility] on 06/16/17 with last f/u visit for patient.

## 2017-07-14 ENCOUNTER — Ambulatory Visit (INDEPENDENT_AMBULATORY_CARE_PROVIDER_SITE_OTHER): Payer: 59 | Admitting: Psychologist

## 2017-07-14 ENCOUNTER — Encounter: Payer: Self-pay | Admitting: Psychologist

## 2017-07-14 DIAGNOSIS — F411 Generalized anxiety disorder: Secondary | ICD-10-CM

## 2017-07-14 NOTE — Progress Notes (Signed)
  [facility] [address] [place] [address] Dept: [phone] Dept Fax: [phone] Loc: [phone] Loc Fax: [phone]  Psychology Therapy Session Progress Note  Patient ID: Kristine Edwards, female  DOB: 10-Jun-1996, 21 y.o.  MRN: [id number]  07/14/2017 Start time: 3 PM End time: 3:50 PM  Present: patient  Service provided: 90834P Individual Psychotherapy (45 min.)  Current Concerns: Anxiety, social isolation, difficulty navigating school ([employer]) application/admissions process  Current Symptoms: Anxiety  Mental Status: Appearance: Well Groomed Attention: good  Motor Behavior: Normal Affect: Full Range Mood: anxious Thought Process: normal Thought Content: normal Suicidal Ideation: None Homicidal Ideation:None Orientation: time, place and person Insight: Fair Judgement: Fair  Diagnosis: Generalized anxiety disorder  Long Term Treatment Goals:  1) decrease anxiety 2) resist flight/freeze response 3) identify anxiety inducing thoughts 4) use relaxation strategies (deep breathing, visualization, cognitive cueing, muscle relaxation)     Anticipated Frequency of Visits: As needed Anticipated Length of Treatment Episode: As needed   Treatment Intervention: Cognitive Behavioral therapy  Response to Treatment: Positive  Medical Necessity: Assisted patient to achieve or maintain maximum functional capacity  Plan: CBT  LEWIS,R. MARK 07/14/2017

## 2017-08-05 DIAGNOSIS — L700 Acne vulgaris: Secondary | ICD-10-CM | POA: Diagnosis not present

## 2017-08-11 ENCOUNTER — Other Ambulatory Visit: Payer: Self-pay | Admitting: Family

## 2017-08-11 DIAGNOSIS — F902 Attention-deficit hyperactivity disorder, combined type: Secondary | ICD-10-CM

## 2017-08-11 MED ORDER — VYVANSE 40 MG PO CAPS: 80.0000 mg | ORAL_CAPSULE | Freq: Every day | ORAL | 0 refills | Status: DC

## 2017-08-11 NOTE — Telephone Encounter (Signed)
Mom called for refill for Vyvanse 40 mg.  Patient last seen 06/16/17, next appointment 09/16/17.

## 2017-08-11 NOTE — Telephone Encounter (Signed)
Printed Rx and placed at front desk for pick-up  

## 2017-09-08 ENCOUNTER — Other Ambulatory Visit: Payer: Self-pay | Admitting: Family

## 2017-09-08 DIAGNOSIS — F902 Attention-deficit hyperactivity disorder, combined type: Secondary | ICD-10-CM

## 2017-09-08 MED ORDER — VYVANSE 40 MG PO CAPS: 80.0000 mg | ORAL_CAPSULE | Freq: Every day | ORAL | 0 refills | Status: DC

## 2017-09-08 NOTE — Telephone Encounter (Signed)
Printed Rx and placed at front desk for pick-up  

## 2017-09-08 NOTE — Telephone Encounter (Signed)
Mom called for refill for Vyvanse 40 mg.  Patient last seen 06/16/17, next appointment 09/16/17. °

## 2017-09-09 ENCOUNTER — Encounter: Payer: Self-pay | Admitting: Psychologist

## 2017-09-09 ENCOUNTER — Ambulatory Visit (INDEPENDENT_AMBULATORY_CARE_PROVIDER_SITE_OTHER): Payer: 59 | Admitting: Psychologist

## 2017-09-09 DIAGNOSIS — F902 Attention-deficit hyperactivity disorder, combined type: Secondary | ICD-10-CM | POA: Diagnosis not present

## 2017-09-09 DIAGNOSIS — F411 Generalized anxiety disorder: Secondary | ICD-10-CM

## 2017-09-09 NOTE — Progress Notes (Signed)
  [facility] [address] [place]. [address] [place] [address] Dept: [phone] Dept Fax: [phone] Loc: [phone] Loc Fax: [phone]  Psychology Therapy Session Progress Note  Patient ID: Mahira Petras, female  DOB: 1996-08-30, 21 y.o.  MRN: [id number]  09/09/2017 Start time: 8 AM End time: 8:50 AM  Present: patient  Service provided: 56433I Individual Psychotherapy (45 min.)  Current Concerns: Anxiety which is significantly improved. She is attending [employer] taking 12 credit hours of classes including history, art, math, and English all at the college transfer level. So far grades are excellent. Executive functioning skills remain to compromise and an area of weakness. ADHD.  Current Symptoms: Anxiety, Attention problem and Organization problem  Mental Status: Appearance: Well Groomed Attention: good  Motor Behavior: Normal Affect: Full Range Mood: anxious Thought Process: normal Thought Content: normal Suicidal Ideation: None Homicidal Ideation:None Orientation: time, place and person Insight: Fair Judgement: Fair  Diagnosis: Generalized anxiety disorder, ADHD  Long Term Treatment Goals:  1) decrease anxiety 2) resist flight/freeze response 3) identify anxiety inducing thoughts 4) use relaxation strategies (deep breathing, visualization, cognitive cueing, muscle relaxation)   1) decrease impulsivity 2) increase self-monitoring 3) increase organizational skills 4) increase time management skills 5) increased behavioral regulation 6) increase self-monitoring 7) utilized cognitive behavioral principles    Anticipated Frequency of Visits: As needed Anticipated Length of Treatment Episode: As needed  Treatment Intervention: Cognitive Behavioral therapy  Response to Treatment: Positive  Medical Necessity: Assisted patient to  achieve or maintain maximum functional capacity  Plan: CBT  Kaleea Penner. MARK 09/09/2017

## 2017-09-16 ENCOUNTER — Ambulatory Visit (INDEPENDENT_AMBULATORY_CARE_PROVIDER_SITE_OTHER): Payer: 59 | Admitting: Family

## 2017-09-16 ENCOUNTER — Encounter: Payer: Self-pay | Admitting: Family

## 2017-09-16 VITALS — BP 110/74 | HR 78 | Resp 16 | Ht 61.0 in | Wt 122.0 lb

## 2017-09-16 DIAGNOSIS — F419 Anxiety disorder, unspecified: Secondary | ICD-10-CM | POA: Diagnosis not present

## 2017-09-16 DIAGNOSIS — F411 Generalized anxiety disorder: Secondary | ICD-10-CM | POA: Diagnosis not present

## 2017-09-16 DIAGNOSIS — R000 Tachycardia, unspecified: Secondary | ICD-10-CM

## 2017-09-16 DIAGNOSIS — R278 Other lack of coordination: Secondary | ICD-10-CM

## 2017-09-16 DIAGNOSIS — Z79899 Other long term (current) drug therapy: Secondary | ICD-10-CM

## 2017-09-16 DIAGNOSIS — Z719 Counseling, unspecified: Secondary | ICD-10-CM | POA: Diagnosis not present

## 2017-09-16 DIAGNOSIS — F902 Attention-deficit hyperactivity disorder, combined type: Secondary | ICD-10-CM

## 2017-09-16 DIAGNOSIS — F819 Developmental disorder of scholastic skills, unspecified: Secondary | ICD-10-CM

## 2017-09-16 MED ORDER — VYVANSE 40 MG PO CAPS: 80.0000 mg | ORAL_CAPSULE | Freq: Every day | ORAL | 0 refills | Status: DC

## 2017-09-16 MED ORDER — SERTRALINE HCL 100 MG PO TABS: 100.0000 mg | ORAL_TABLET | Freq: Every day | ORAL | 0 refills | Status: DC

## 2017-09-16 NOTE — Progress Notes (Signed)
[facility] [address] [place]. [address] [place] [address] Dept: [phone] Dept Fax: [phone] Loc: [phone] Loc Fax: [phone]  Medical Follow-up  Patient ID: Kristine Edwards, female  DOB: 09-16-1996, 21 y.o.  MRN: [id number]  Date of Evaluation: 09/16/17  PCP: Gweneth Dimitri, MD  Accompanied by: Self Patient Lives with: parents  HISTORY/CURRENT STATUS:  HPI  Patient here for routine follow up related to ADHD, Anxiety, learning problems, and medication management. Patient here by herself for today's visit and interactive with provider. Talkative about school and home with various subjects covered at today's visit. Taking 4 classes at [employer] this semester and doing well with organizational skills. Has continued to Vyvanse 40 mg 2 daily and Zoloft 100 mg daily with no reported side effects per patient.   EDUCATION: School: [employer]-taking 4 classes Year/Grade: Freshman Homework Time: 2 Hours Performance/Grades: average Services: Other: Disability Services on [employer] Activities/Exercise: intermittently-walking more on campus  MEDICAL HISTORY: Appetite: Good MVI/Other: Vitamin D and Fe with MVI daily Fruits/Vegs:Some Calcium: Some Iron:Some  Sleep: Bedtime: 10-12:00 am Awakens: 7:00 am Sleep Concerns: Initiation/Maintenance/Other: No problems reported  Individual Medical History/Review of System Changes? None reported recently.  Allergies: Patient has no known allergies.  Current Medications:  Current Outpatient Prescriptions:  .  Adapalene 0.3 % gel, Use as directed, Disp: , Rfl:  .  IRON PO, Take 1 tablet by mouth daily., Disp: , Rfl:  .  ivabradine (CORLANOR) 7.5 MG TABS tablet, Take 1 tablet (7.5 mg total) by mouth 2 (two) times daily with a meal., Disp: 60 tablet, Rfl: 5 .  sertraline (ZOLOFT) 100 MG tablet, Take 1 tablet (100 mg total)  by mouth daily. Take 1 tab daily. 3 month supply., Disp: 90 tablet, Rfl: 0 .  Vitamin D, Ergocalciferol, (DRISDOL) 50000 units CAPS capsule, Take 1 capsule by mouth once a week., Disp: , Rfl:  .  VYVANSE 40 MG capsule, Take 2 capsules (80 mg total) by mouth daily with breakfast. Do not fill until after 11/08/17, Disp: 60 capsule, Rfl: 0 Medication Side Effects: None  Family Medical/Social History Changes?: None recently  MENTAL HEALTH: Mental Health Issues: Anxiety-occasionally with class requirements.   PHYSICAL EXAM: Vitals:  Today's Vitals   09/16/17 1419  BP: 110/74  Pulse: 78  Resp: 16  Weight: 122 lb (55.3 kg)  Height:  (1.549 m)  PainSc: 0-No pain  , Facility age limit for growth percentiles is 20 years.  General Exam: Physical Exam  Constitutional: She is oriented to person, place, and time. She appears well-developed and well-nourished.  HENT:  Head: Normocephalic and atraumatic.  Right Ear: External ear normal.  Left Ear: External ear normal.  Mouth/Throat: Oropharynx is clear and moist.  Eyes: Pupils are equal, round, and reactive to light. Conjunctivae and EOM are normal.  Corrective lenses  Neck: Normal range of motion. Neck supple.  Cardiovascular: Normal rate, regular rhythm, normal heart sounds and intact distal pulses.   Pulmonary/Chest: Effort normal and breath sounds normal.  Abdominal: Soft. Bowel sounds are normal.  Genitourinary:  Genitourinary Comments: Deferred  Musculoskeletal: Normal range of motion.  Curvature of the spine  Neurological: She is alert and oriented to person, place, and time. She has normal reflexes.  Skin: Skin is warm and dry. Capillary refill takes less than 2 seconds.  Psychiatric: She has a normal mood and affect. Her behavior is normal. Judgment and thought content normal.  Vitals reviewed.  Review of Systems  Psychiatric/Behavioral: Positive for decreased concentration. The patient is nervous/anxious.   All other  systems reviewed and are negative.  Patient with no concerns for toileting. Daily stool, no constipation or diarrhea. Void urine no difficulty. No enuresis.   Participate in daily oral hygiene to include brushing and flossing.  Neurological: oriented to time, place, and person Cranial Nerves: normal  Neuromuscular:  Motor Mass: Normal Tone: Normal Strength: Normal DTRs: 2+ and symmetric Overflow: None Reflexes: no tremors noted Sensory Exam: Vibratory: Intact  Fine Touch: Intact  Testing/Developmental Screens:  ASRS-26/14 scored by patient and cousneled at today's visit    DIAGNOSES:    ICD-10-CM   1. Attention deficit hyperactivity disorder (ADHD), combined type F90.2   2. Anxiety F41.9   3. 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 Medication management Z79.899   5. Patient counseled Z71.9   6. Dysgraphia R27.8   7. Problems with learning F81.9   8. Generalized anxiety disorder F41.1 sertraline (ZOLOFT) 100 MG tablet  9. ADHD (attention deficit hyperactivity disorder), combined type F90.2 VYVANSE 40 MG capsule    DISCONTINUED: VYVANSE 40 MG capsule    RECOMMENDATIONS: 3 month follow up and continuation of medication. Counseled on medication management with current medication regimen. Vyvanse 40 mg 2 daily, # 60 script called in and patient picked up today. Two prescriptions provided, two with fill after dates for 10/09/17 and 11/08/17. Zoloft 100 mg daily, # 90 with no refills escribed to pharmacy on file.   Counseled patienton medication administration, effects, and possible side effects. ADHD medications discussed to include different medications and pharmacologic properties of each. Recommendation for specific medication to include dose, administration, expected effects, possible side effects and the risk to benefit ratio of medication management at today's visit for Vyvanse and Zoloft.   Information reviewed with patient today regarding school classes and schedule.  Encouraged organizational skills and time management for assignments and test/exams. Desk calendar or wall calendar with a reminder system to alert when important information is due or a test.   Recommended patient incorporate more cardiovascular exercise into her routine. Walking more and taking the stairs at school, but not active unless on campus. Suggestions to given on ways to increased physical activity for at least 20-30 mins for 3-4 times/week.   Information reviewed regarding continued back pain with spinal curvature and orthopedic f/u in the last year. Patient to continue with sitting up straight and performing abdominal strengthening exercises to support the spine.   Directed to f/u with PCP yearly, MVI daily with supplements, sleep hygiene, exercise regularly, healthy eating habits, and orthopedic/caridology follow ups as recommended.  NEXT APPOINTMENT: Return in about 3 months (around 12/17/2017) for follow up visit.  More than 50% of the appointment was spent counseling and discussing diagnosis and management of symptoms with the patient and family.  Carron Curie, NP Counseling Time: 30 mins Total Contact Time: 40 mins

## 2017-10-29 ENCOUNTER — Other Ambulatory Visit: Payer: Self-pay | Admitting: Cardiovascular Disease

## 2017-10-29 DIAGNOSIS — R000 Tachycardia, unspecified: Secondary | ICD-10-CM

## 2017-11-01 NOTE — Telephone Encounter (Signed)
Please review for refill, Thanks !  

## 2017-11-23 DIAGNOSIS — D509 Iron deficiency anemia, unspecified: Secondary | ICD-10-CM | POA: Diagnosis not present

## 2017-11-23 DIAGNOSIS — E559 Vitamin D deficiency, unspecified: Secondary | ICD-10-CM | POA: Diagnosis not present

## 2017-11-29 ENCOUNTER — Other Ambulatory Visit: Payer: Self-pay | Admitting: Cardiovascular Disease

## 2017-11-29 DIAGNOSIS — R000 Tachycardia, unspecified: Secondary | ICD-10-CM

## 2017-11-29 NOTE — Telephone Encounter (Signed)
refill 

## 2017-11-29 NOTE — Telephone Encounter (Signed)
Please review for refill. Thanks!  

## 2017-12-02 DIAGNOSIS — D509 Iron deficiency anemia, unspecified: Secondary | ICD-10-CM | POA: Diagnosis not present

## 2017-12-02 DIAGNOSIS — E559 Vitamin D deficiency, unspecified: Secondary | ICD-10-CM | POA: Diagnosis not present

## 2017-12-03 ENCOUNTER — Other Ambulatory Visit: Payer: Self-pay | Admitting: Family

## 2017-12-03 DIAGNOSIS — F902 Attention-deficit hyperactivity disorder, combined type: Secondary | ICD-10-CM

## 2017-12-03 MED ORDER — VYVANSE 40 MG PO CAPS: 80.0000 mg | ORAL_CAPSULE | Freq: Every day | ORAL | 0 refills | Status: DC

## 2017-12-03 NOTE — Telephone Encounter (Signed)
Printed Rx and placed at front desk for pick-up  

## 2017-12-03 NOTE — Telephone Encounter (Signed)
Mom called for refill for Vyvanse 40 mg, #60.  Patient last seen 09/16/17.  Left message for patient to call and schedule follow-up.

## 2017-12-23 ENCOUNTER — Encounter: Payer: Self-pay | Admitting: Family

## 2017-12-23 ENCOUNTER — Telehealth: Payer: Self-pay | Admitting: Family

## 2017-12-23 ENCOUNTER — Ambulatory Visit (INDEPENDENT_AMBULATORY_CARE_PROVIDER_SITE_OTHER): Payer: 59 | Admitting: Family

## 2017-12-23 VITALS — BP 112/64 | HR 76 | Resp 16 | Ht 61.0 in | Wt 121.4 lb

## 2017-12-23 DIAGNOSIS — Z719 Counseling, unspecified: Secondary | ICD-10-CM | POA: Diagnosis not present

## 2017-12-23 DIAGNOSIS — R000 Tachycardia, unspecified: Secondary | ICD-10-CM

## 2017-12-23 DIAGNOSIS — F819 Developmental disorder of scholastic skills, unspecified: Secondary | ICD-10-CM | POA: Diagnosis not present

## 2017-12-23 DIAGNOSIS — F411 Generalized anxiety disorder: Secondary | ICD-10-CM

## 2017-12-23 DIAGNOSIS — Z79899 Other long term (current) drug therapy: Secondary | ICD-10-CM

## 2017-12-23 DIAGNOSIS — F902 Attention-deficit hyperactivity disorder, combined type: Secondary | ICD-10-CM | POA: Diagnosis not present

## 2017-12-23 DIAGNOSIS — R278 Other lack of coordination: Secondary | ICD-10-CM | POA: Diagnosis not present

## 2017-12-23 MED ORDER — SERTRALINE HCL 100 MG PO TABS
100.0000 mg | ORAL_TABLET | Freq: Every day | ORAL | 0 refills | Status: DC
Start: 2017-12-23 — End: 2018-03-10

## 2017-12-23 MED ORDER — VYVANSE 40 MG PO CAPS: 80.0000 mg | ORAL_CAPSULE | Freq: Every day | ORAL | 0 refills | Status: DC

## 2017-12-23 NOTE — Telephone Encounter (Signed)
Patient show up to the appointment and has completed paper work for 2019.

## 2017-12-23 NOTE — Progress Notes (Signed)
[facility] [address] [place] [place] [address] Dept: [phone] Dept Fax: [phone] Loc: [phone] Loc Fax: [phone]  Medical Follow-up  Patient ID: Kristine Edwards, female  DOB: May 27, 1996, 22 y.o.  MRN: [id number]  Date of Evaluation: 12/23/2017  PCP: Gweneth Dimitri, MD  Accompanied by: self  Patient Lives with: parents  HISTORY/CURRENT STATUS:  HPI  Patient here for routine follow up related to ADHD, Anxiety, learning problems, Dysgraphia, and medication management. Patient here by herself in the exam room today with mother in the waiting room. Patient interactive with provider and appropriate at today's visit.  Doing well this semester with 5 classes and taking classes daily. Getting done at 1:50 pm daily and doing homework at home in the evening. Eating and sleeping well with no problems reported. Doing well with her health and keeping track of her Vitamin D levels with a daily supplement. No problems or side effects with her Vyvanse and Zoloft daily.   EDUCATION: School: [employer]- 5 classes this semester Year/Grade: freshman Homework Time: Depending on class demands Performance/Grades: average Services: IEP/504 Plan/Accommodations Activities/Exercise: intermittently-walking on campus  MEDICAL HISTORY: Appetite: Good MVI/Other: Daily Fruits/Vegs:Some Calcium: Some Iron:Some  Sleep: Bedtime: 10-12:00 am Awakens: 6:00 am Sleep Concerns: Initiation/Maintenance/Other: No problems reported  Individual Medical History/Review of System Changes? None reported recently  Allergies: Patient has no known allergies.  Current Medications:  Current Outpatient Medications:  .  CORLANOR 7.5 MG TABS tablet, TAKE 1 TABLET BY MOUTH TWICE DAILY WITH A MEAL., Disp: 60 tablet, Rfl: 6 .  sertraline (ZOLOFT) 100 MG tablet, Take 1 tablet (100 mg  total) by mouth daily. Take 1 tab daily. 3 month supply., Disp: 90 tablet, Rfl: 0 .  Vitamin D, Ergocalciferol, (DRISDOL) 50000 units CAPS capsule, Take 1 capsule by mouth once a week., Disp: , Rfl:  .  VYVANSE 40 MG capsule, Take 2 capsules (80 mg total) by mouth daily with breakfast., Disp: 60 capsule, Rfl: 0 Medication Side Effects: None  Family Medical/Social History Changes?: None reported recently.   MENTAL HEALTH: Mental Health Issues: Anxiety-Zoloft with 100 mg 1 daily with no side effects. No suicidal ideations or thoughts reported by patient.   PHYSICAL EXAM: Vitals:  Today's Vitals   12/23/17 0915  BP: 112/64  Pulse: 76  Resp: 16  Weight: 121 lb 6.4 oz (55.1 kg)  Height: 5\' 1"  (1.549 m)  PainSc: 0-No pain  , Facility age limit for growth percentiles is 20 years.  General Exam: Physical Exam  Constitutional: She is oriented to person, place, and time. She appears well-developed and well-nourished.  HENT:  Head: Normocephalic and atraumatic.  Right Ear: External ear normal.  Left Ear: External ear normal.  Mouth/Throat: Oropharynx is clear and moist.  Eyes: Conjunctivae and EOM are normal. Pupils are equal, round, and reactive to light.  Neck: Normal range of motion. Neck supple.  Cardiovascular: Normal rate, regular rhythm, normal heart sounds and intact distal pulses.  Pulmonary/Chest: Effort normal and breath sounds normal.  Abdominal: Soft. Bowel sounds are normal.  Genitourinary:  Genitourinary Comments: Deferred  Musculoskeletal: Normal range of motion.  Significant curvature of the spine  Neurological: She is alert and oriented to person, place, and time. She has normal reflexes.  Skin: Skin is warm and dry. Capillary refill takes less than 2 seconds.  Psychiatric: She has a normal mood and affect. Her behavior is normal. Judgment and thought content  normal.  Vitals reviewed.  Review of Systems  Psychiatric/Behavioral: Positive for decreased concentration.  The patient is nervous/anxious.   All other systems reviewed and are negative.  Patient with no concerns for toileting. Daily stool, no constipation or diarrhea. Void urine no difficulty. No enuresis.   Participate in daily oral hygiene to include brushing and flossing.  Neurological: oriented to time, place, and person Cranial Nerves: normal  Neuromuscular:  Motor Mass: Normal  Tone: Normal  Strength: Normal  DTRs: 2+ and symmetric Overflow: None Reflexes: no tremors noted Sensory Exam: Vibratory: Intact   Fine Touch: Intact  Testing/Developmental Screens:  23/16 scored by patient and counseled  DIAGNOSES:    ICD-10-CM   1. Attention deficit hyperactivity disorder (ADHD), combined type F90.2   2. Generalized anxiety disorder F41.1 sertraline (ZOLOFT) 100 MG tablet  3. Problems with learning F81.9   4. Dysgraphia R27.8   5. Medication management Z79.899   6. Patient counseled Z71.9   7. 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 [facility] with no refills (3 month supply). Vyvanse 40 mg 2 daily, # 60 Rx printed and given to patient.   Reviewed old records and/or current chart since last visit 3 months ago with any reported changes.   Discussed recent history and today's examination with questions answered and concerns address with patient regarding medication management with change in pharmacy company.  Counseled regarding anticipatory guidance with school work now and 2nd semester of college.   Recommended a high protein, low sugar and preservatives diet for ADHD patient. To avoid junk foods or fast foods when possible.   Counseled on the need to increase exercise and make healthy eating choices daily. Increase her water intake daily with 3-5 smaller meals each day. To increased exercise when possible at least 20 minutes daily.   Discussed school progress and  advocated for appropriate accommodations/modifications with college classes for success.   Advised on medication options, administration, effects, and possible side effects Vyvanse and Zoloft daily.   Instructed on the importance of good sleep hygiene, a routine bedtime, no TV in bedroom and limiting exposure in the evenings. Turning off all screens at least 1 hour before getting into the bed.   Advised limiting video and screen time to less than 2 hours per day and limiting exposure each night unless she has school work to complete.   Directed to PCP yearly, dentist every 6 months, chiropractor as needed, healthy eating choices, exercise, MVI with supplements, and good sleep hygiene. Can also follow up with Dr. Melvyn NethLewis for counseling as needed.   NEXT APPOINTMENT: Return in about 3 months (around 03/23/2018) for follow up visit.  More than 50% of the appointment was spent counseling and discussing diagnosis and management of symptoms with the patient and family.  Carron Curieawn M Paretta-Leahey, NP Counseling Time: 30 mins Total Contact Time: 40 mins

## 2017-12-24 ENCOUNTER — Telehealth: Payer: Self-pay | Admitting: Family

## 2017-12-24 NOTE — Telephone Encounter (Signed)
PA initiated through Cover my meds for Vyvanse 40 mg 2 daily.  Key: [id number] - PA Case ID: [id number]

## 2017-12-29 NOTE — Telephone Encounter (Signed)
Documentation sent via Cover My meds for Vyvanse 40 mg with results of N/A for prior authorization received from insurance.   This medication or product was previously approved on ~[id number] from 12/07/2017 to 03/06/2018. You will be able to fill a prescription for this medication at your pharmacy. If your pharmacy has questions regarding the processing of your prescription, please have them call the [facility] pharmacy help desk at [phone].

## 2018-02-02 ENCOUNTER — Other Ambulatory Visit: Payer: Self-pay | Admitting: Family

## 2018-02-02 MED ORDER — VYVANSE 40 MG PO CAPS: 80.0000 mg | ORAL_CAPSULE | Freq: Every day | ORAL | 0 refills | Status: DC

## 2018-02-02 NOTE — Telephone Encounter (Signed)
Mom called for refill for Vyvanse 40 mg.  Patient last seen 12/23/17, next appointment 03/10/18.  [facility] [address]

## 2018-02-02 NOTE — Telephone Encounter (Signed)
RX for above e-scribed and sent to pharmacy on record  [facility] - [place], [place] - [address] [place] [address] Phone: [phone] Fax: [phone]    

## 2018-02-21 ENCOUNTER — Telehealth: Payer: Self-pay | Admitting: Family

## 2018-02-21 MED ORDER — LISDEXAMFETAMINE DIMESYLATE 10 MG PO CAPS: 10.0000 mg | ORAL_CAPSULE | Freq: Every day | ORAL | 0 refills | Status: DC

## 2018-02-21 MED ORDER — LISDEXAMFETAMINE DIMESYLATE 70 MG PO CAPS
70.0000 mg | ORAL_CAPSULE | Freq: Every day | ORAL | 0 refills | Status: DC
Start: 2018-02-21 — End: 2018-03-10

## 2018-02-21 NOTE — Telephone Encounter (Signed)
T/C with mother regarding denial of Vyvanse 40 mg 2 capsules daily due to quantity of 60 not covered by insurance. Mother wanting to trial Vyvanse 70 mg and 10 mg # 30 each.  RX for above e-scribed and sent to pharmacy on record  [facility] [place] [place] [address] Phone: [phone] Fax: [phone]

## 2018-03-10 ENCOUNTER — Encounter: Payer: Self-pay | Admitting: Family

## 2018-03-10 ENCOUNTER — Ambulatory Visit (INDEPENDENT_AMBULATORY_CARE_PROVIDER_SITE_OTHER): Payer: 59 | Admitting: Family

## 2018-03-10 VITALS — BP 102/62 | HR 68 | Resp 16 | Ht 61.0 in | Wt 121.4 lb

## 2018-03-10 DIAGNOSIS — Z79899 Other long term (current) drug therapy: Secondary | ICD-10-CM | POA: Diagnosis not present

## 2018-03-10 DIAGNOSIS — Z8679 Personal history of other diseases of the circulatory system: Secondary | ICD-10-CM

## 2018-03-10 DIAGNOSIS — F902 Attention-deficit hyperactivity disorder, combined type: Secondary | ICD-10-CM | POA: Diagnosis not present

## 2018-03-10 DIAGNOSIS — F411 Generalized anxiety disorder: Secondary | ICD-10-CM

## 2018-03-10 DIAGNOSIS — F819 Developmental disorder of scholastic skills, unspecified: Secondary | ICD-10-CM

## 2018-03-10 DIAGNOSIS — Z719 Counseling, unspecified: Secondary | ICD-10-CM

## 2018-03-10 DIAGNOSIS — F419 Anxiety disorder, unspecified: Secondary | ICD-10-CM | POA: Diagnosis not present

## 2018-03-10 MED ORDER — LISDEXAMFETAMINE DIMESYLATE 30 MG PO CAPS: 30.0000 mg | ORAL_CAPSULE | Freq: Every day | ORAL | 0 refills | Status: DC

## 2018-03-10 MED ORDER — SERTRALINE HCL 100 MG PO TABS: 100.0000 mg | ORAL_TABLET | Freq: Every day | ORAL | 0 refills | Status: DC

## 2018-03-10 MED ORDER — LISDEXAMFETAMINE DIMESYLATE 50 MG PO CAPS: 50.0000 mg | ORAL_CAPSULE | Freq: Every day | ORAL | 0 refills | Status: DC

## 2018-03-10 NOTE — Progress Notes (Signed)
[facility] [address] [place] [address] Dept: [phone] Dept Fax: [phone] Loc: [phone] Loc Fax: [phone]  Medication Check  Patient ID: Kristine Edwards, female  DOB: 1996/09/29, 22 y.o.  MRN: [id number]  Date of Evaluation: 03/10/2018  PCP: Gweneth DimitriMcNeill, Wendy, MD  Accompanied by: Self Patient Lives with: parents  HISTORY/CURRENT STATUS: HPI  Patient here for routine follow up related to ADHD, Anxiety, learning, dysgraphia, and medication management. Patient here by herself for today's visit. Interactive and cooperative with provider. Has continued to attend [employer] with taking 5 classes this semester along with doing better with time management. Has continued to attend daily classes and homework after 1:50 pm then doing homework. Eating well and sleeping better this semester. Has continued to take Vyvanse 70 mg and 10 mg with no side effects, but patient requested to change dosing forms to 50 mg and 30 mg.  EDUCATION: School: [employer]  Year/Grade: Freshman Homework Hours Spent: depends on class demands Performance/ Grades: average Services: Other: [employer] Activities/ Exercise: intermittently-walking more and playing with the dogs  MEDICAL HISTORY: Appetite: Good  MVI/Other: Vitamin D, MVI  Fruits/Vegs: some Calcium: some  Iron: some  Sleep: Bedtime: 1:30 am  Awakens: 8:00 am  Concerns: Initiation/Maintenance/Other: Some reading in bed at night if not tired.  Individual Medical History/ Review of Systems: Changes? :None report by patient.   Allergies: Patient has no known allergies.  Current Medications:  Current Outpatient Medications:  .  CORLANOR 7.5 MG TABS tablet, TAKE 1 TABLET BY MOUTH TWICE DAILY WITH A MEAL., Disp: 60 tablet, Rfl: 6 .  sertraline (ZOLOFT) 100 MG tablet, Take 1 tablet (100 mg total) by  mouth daily. Take 1 tab daily. 3 month supply., Disp: 90 tablet, Rfl: 0 .  Vitamin D, Ergocalciferol, (DRISDOL) 50000 units CAPS capsule, Take 1 capsule by mouth once a week., Disp: , Rfl:  .  lisdexamfetamine (VYVANSE) 30 MG capsule, Take 1 capsule (30 mg total) by mouth daily., Disp: 30 capsule, Rfl: 0 .  lisdexamfetamine (VYVANSE) 50 MG capsule, Take 1 capsule (50 mg total) by mouth daily., Disp: 30 capsule, Rfl: 0 Medication Side Effects: None  Family Medical/ Social History: Changes? None recently  MENTAL HEALTH: Mental Health Issues: Anxiety-well controlled with Zoloft  PHYSICAL EXAM; Vitals:  Vitals:   03/10/18 0918  BP: 102/62  Pulse: 68  Resp: 16  Weight: 121 lb 6.4 oz (55.1 kg)  Height: 5\' 1"  (1.549 m)    Physical Exam  Constitutional: She is oriented to person, place, and time. She appears well-developed and well-nourished.  HENT:  Head: Normocephalic and atraumatic.  Right Ear: External ear normal.  Left Ear: External ear normal.  Mouth/Throat: Oropharynx is clear and moist.  Eyes: Pupils are equal, round, and reactive to light. Conjunctivae and EOM are normal.  Corrective lenses  Neck: Normal range of motion. Neck supple.  Cardiovascular: Normal rate, regular rhythm, normal heart sounds and intact distal pulses.  Pulmonary/Chest: Effort normal and breath sounds normal.  Abdominal: Soft. Bowel sounds are normal.  Genitourinary:  Genitourinary Comments: Deferred  Musculoskeletal: Normal range of motion.  Neurological: She is alert and oriented to person, place, and time. She has normal reflexes.  Skin: Skin is warm and dry. Capillary refill takes less than 2 seconds.  Psychiatric: She has a normal mood and affect. Her behavior is normal. Judgment and thought content normal.  Vitals  reviewed.  Review of Systems  Psychiatric/Behavioral: Positive for decreased concentration. The patient is nervous/anxious.   All other systems reviewed and are negative.  Patient  with no concerns for toileting. Daily stool, no constipation or diarrhea. Void urine no difficulty. No enuresis.   Participate in daily oral hygiene to include brushing and flossing.  General Physical Exam: Unchanged from previous exam, date: None  Changed:None reported today  Testing/Developmental Screens:  ASRS-22/14 scored by patient and counseled  DIAGNOSES:    ICD-10-CM   1. Attention deficit hyperactivity disorder (ADHD), combined type F90.2   2. Anxiety F41.9   3. Patient counseled Z71.9   4. Medication management Z79.899   5. Problems with learning F81.9   6. Generalized anxiety disorder F41.1 sertraline (ZOLOFT) 100 MG tablet  7. History of sinus tachycardia Z86.79     RECOMMENDATIONS: 3 month follow up and medication management. Counseled at today's visit with medication management. Changed dosing from 70 mg and 10 mg to 50 mg and 30 mg Vyvanse for daily dosing, # 30 each prescription with no refills. Zoloft 100 mg 1 daily, # 90 with no refills.   RX for above e-scribed and sent to pharmacy on record  [facility] - [place], [place] [address] [place] [address] Phone: [phone] Fax: [phone]  Counseling at this visit included the review of old records and/or current chart with the patient along with updates since last visit 3 months ago.   Discussed recent history and today's examination with patient with no changes on examination today.   Counseled regarding college classes and applying for a job this summer.   Recommended a high protein, low sugar and preservatives diet for ADHD patients. To increase her physical activity now and attempt to get better variety of foods with more water intake.   Discussed school academic and behavioral progress and advocated for appropriate accommodations as needed for her college classes.  Maintain Structure, routine, organization, reward, motivation and consequences to  continue with school and home.   Counseled medication administration, effects, and possible side effects.  ADHD medications discussed to include different medications and pharmacologic properties of each. Recommendation for specific medication to include dose, administration, expected effects, possible side effects and the risk to benefit ratio of medication management. Vyvanse daily, as continued.   Advised importance of:  Good sleep hygiene (8- 10 hours per night) Limited screen time (none on school nights, no more than 2 hours on weekends) Regular exercise(outside and active play) Healthy eating (drink water, no sodas/sweet tea, limit portions and no seconds).   Directed patient to f/u with PCP yearly, healthy eating habits, eye exam, dentist as recommended, regular exercise more and continue with counseling as needed.   NEXT APPOINTMENT: Return in about 3 months (around 06/09/2018) for follow up visit.  More than 50% of the appointment was spent counseling and discussing diagnosis and management of symptoms with the patient and family.  Carron Curie, NP Counseling Time: 30 mins Total Contact Time: 40 mins

## 2018-03-31 IMAGING — MR MR THORACIC SPINE W/O CM
4 of 12 series · 14 of 48 positions shown · non-contrast
Comparison: None.

CLINICAL DATA: Mid low back pain.

EXAM:
MRI THORACIC AND LUMBAR SPINE WITHOUT CONTRAST
TECHNIQUE: Multiplanar and multiecho pulse sequences of the thoracic and lumbar
spine were obtained without intravenous contrast.

[Series 4: T2 · sagittal · 4.0mm · 0.33mm/px · 3 of 12 slices shown (1 of 4)]
[im 1/12]
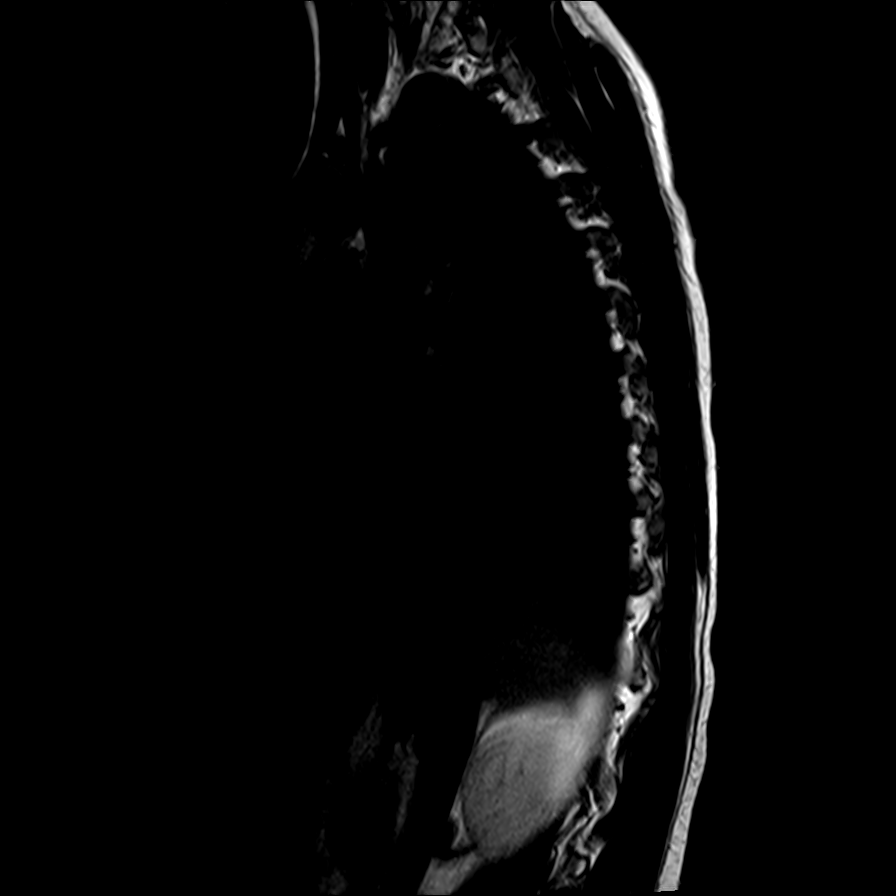
[im 6/12]
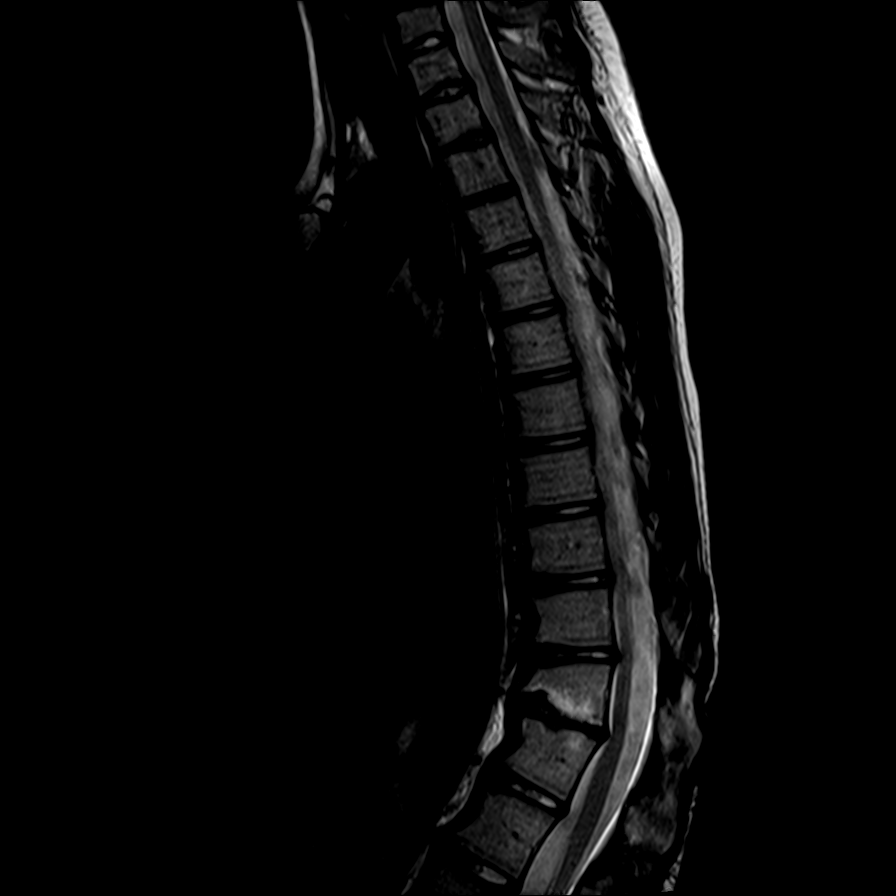
[im 12/12]
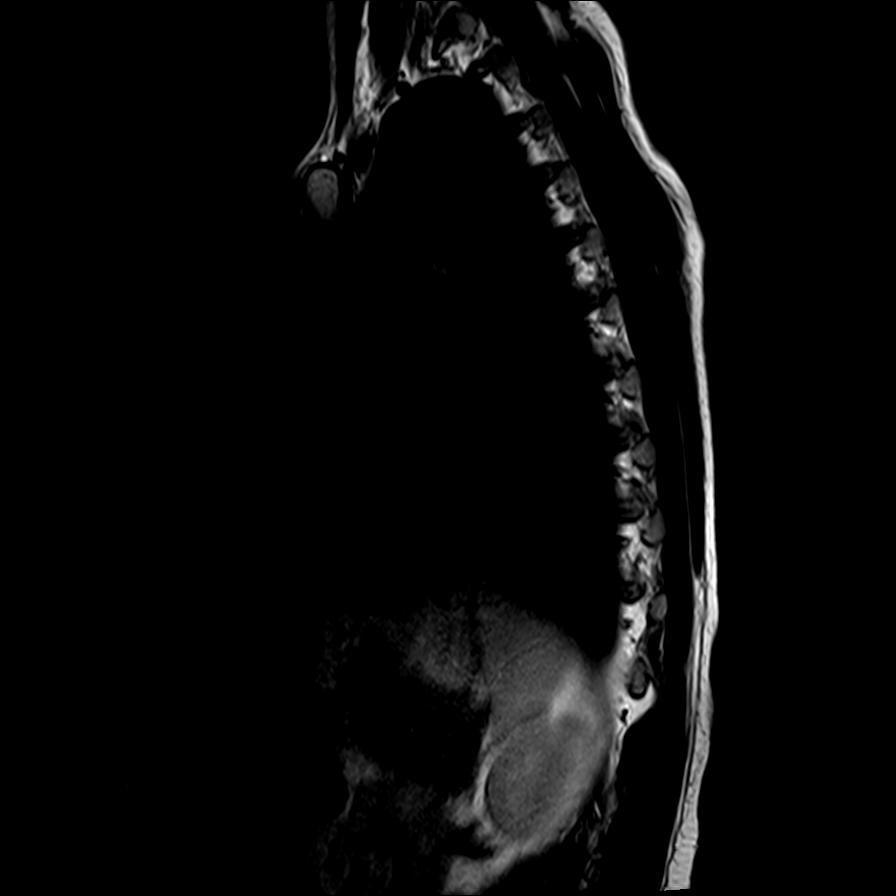

[Series 7: T2 · axial · 4.0mm · 0.39mm/px · z∈[-195,-30]mm · 6 of 37 slices shown (2 of 4)]
[im 1/37]
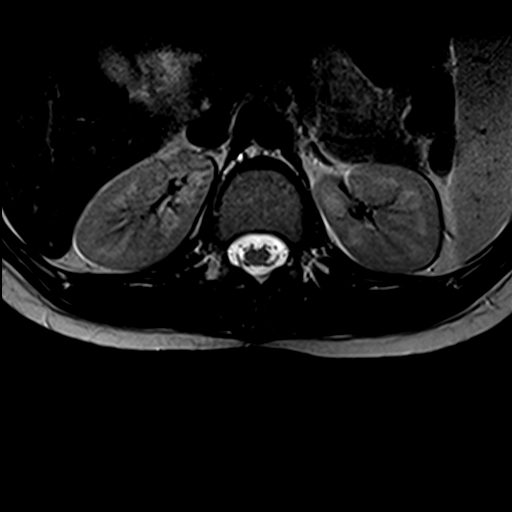
[im 7/37]
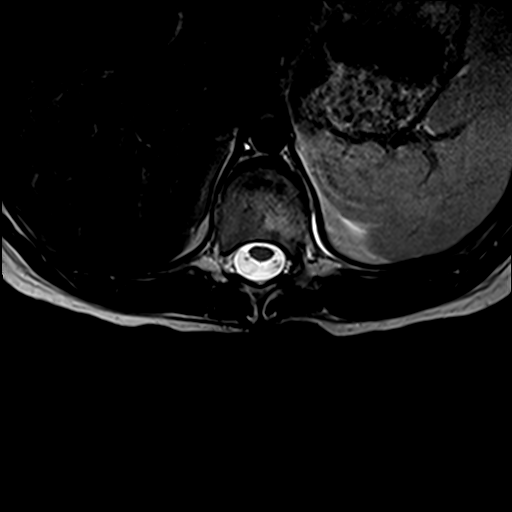
[im 13/37]
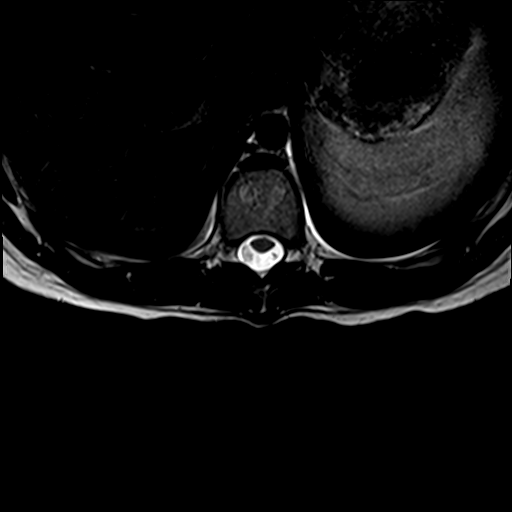
[im 19/37]
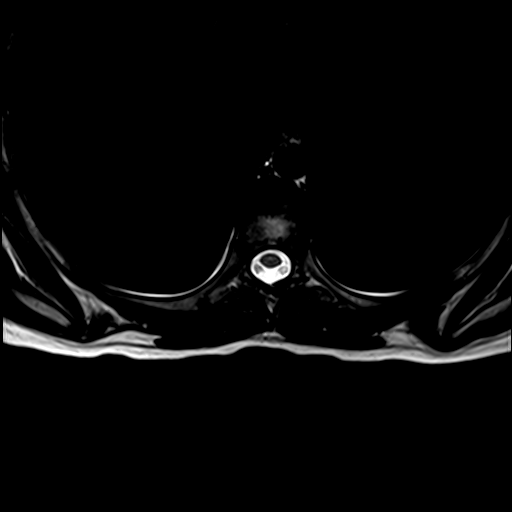
[im 25/37]
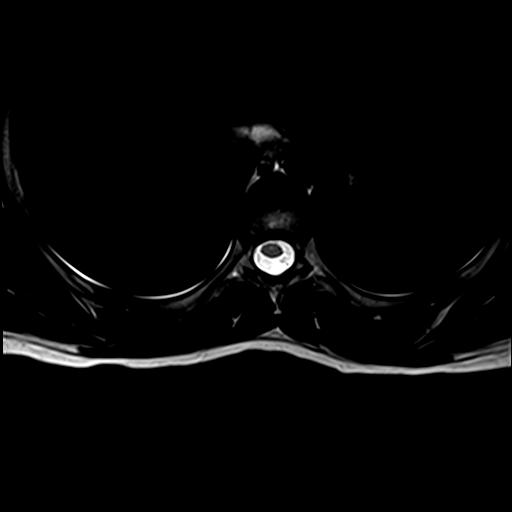
[im 31/37]
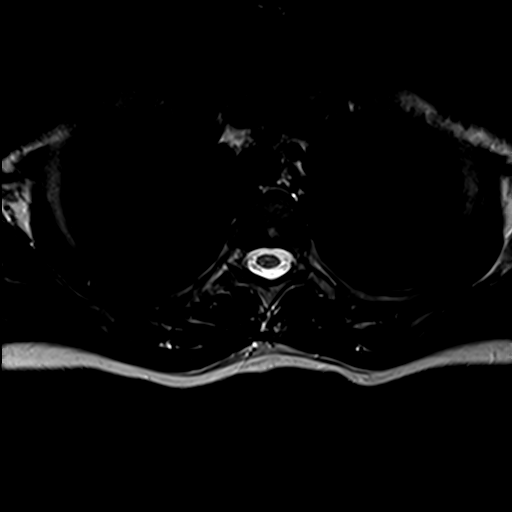

[Series 8: T2 · axial · 4.0mm · 0.39mm/px · z∈[-166,-26]mm · 3 of 37 slices shown (3 of 4)]
[im 7/37]
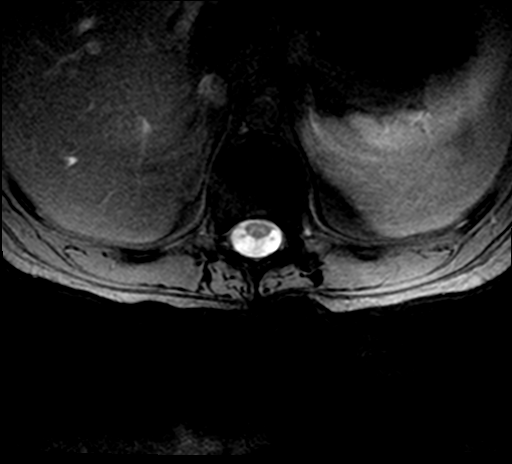
[im 19/37]
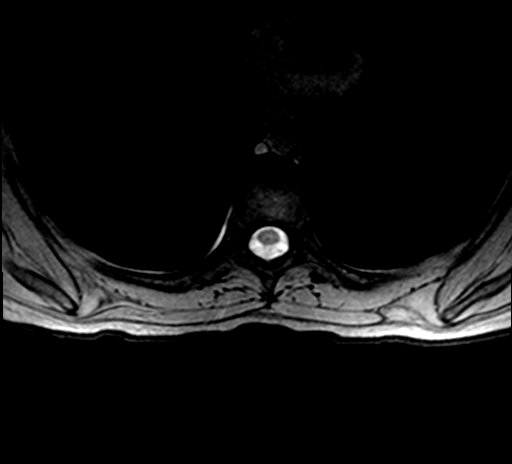
[im 31/37]
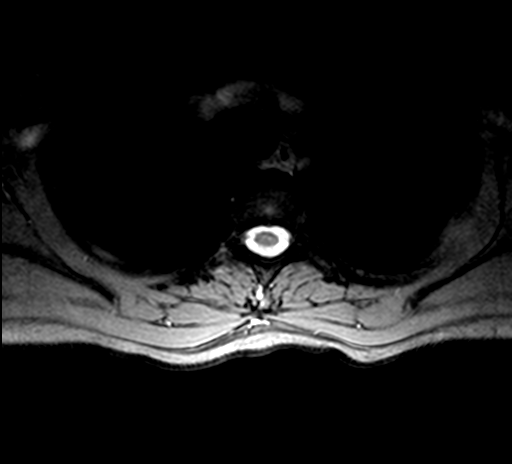

[Series 11: T2 · sagittal · 4.0mm · 0.84mm/px · 2 of 13 slices shown (4 of 4)]
[im 1/13]
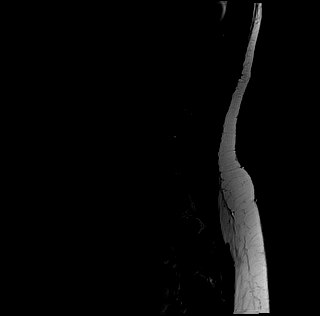
[im 13/13]
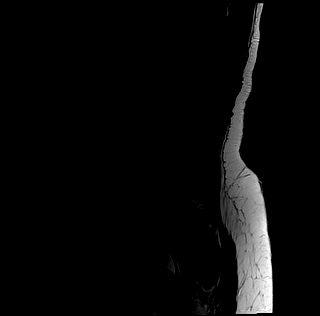

[14 of 48 positions shown; findings below may reference images not displayed]

FINDINGS: MRI THORACIC SPINE FINDINGS

Alignment: No static listhesis. Thoracolumbar kyphosis centered at
T11-12.

Vertebrae: Schmorl's node along the inferior margin of the T11
vertebral body with mild chronic T11 vertebral body height loss. No
fracture, evidence of discitis, or bone lesion.

Cord:  Normal signal and morphology.

Paraspinal and other soft tissues: Negative.

Disc levels:

Disc spaces: Degenerative disc disease with disc height loss at
T11-12 with reactive endplate changes at T11.

T1-T2: No disc protrusion, foraminal stenosis or central canal
stenosis.

T2-T3: No disc protrusion, foraminal stenosis or central canal
stenosis.

T3-T4: No disc protrusion, foraminal stenosis or central canal
stenosis.

T4-T5: No disc protrusion, foraminal stenosis or central canal
stenosis.

T5-T6: No disc protrusion, foraminal stenosis or central canal
stenosis.

T6-T7: No disc protrusion, foraminal stenosis or central canal
stenosis.

T7-T8: No disc protrusion, foraminal stenosis or central canal
stenosis.

T8-T9: No disc protrusion, foraminal stenosis or central canal
stenosis.

T9-T10: No disc protrusion, foraminal stenosis or central canal
stenosis.

T10-T11: Mild broad-based disc bulge. No foraminal or central canal
stenosis.

T11-T12: Mild broad-based disc bulge. No foraminal or central canal
stenosis.

MRI LUMBAR SPINE FINDINGS

Segmentation:  Standard.

Alignment:  Physiologic.

Vertebrae:  No fracture, evidence of discitis, or bone lesion.

Conus medullaris: Extends to the L1 level and appears normal.

Paraspinal and other soft tissues: Negative.

Disc levels:

Disc spaces: Disc spaces are maintained.

T12-L1: Minimal broad-based disc bulge. No evidence of neural
foraminal stenosis. No central canal stenosis.

L1-L2: Minimal broad-based disc bulge. No evidence of neural
foraminal stenosis. No central canal stenosis.

L2-L3: No significant disc bulge. No evidence of neural foraminal
stenosis. No central canal stenosis.

L3-L4: No significant disc bulge. No evidence of neural foraminal
stenosis. No central canal stenosis.

L4-L5: No significant disc bulge. No evidence of neural foraminal
stenosis. No central canal stenosis.

L5-S1: No significant disc bulge. No evidence of neural foraminal
stenosis. No central canal stenosis. Mild bilateral facet
arthropathy.
IMPRESSION: MR THORACIC SPINE IMPRESSION

1. Thoracolumbar kyphosis centered at T11-12 secondary to a
Schmorl's node along the inferior margin of the T11 vertebral body
with mild chronic T11 vertebral body height loss..

MR LUMBAR SPINE IMPRESSION

1. No significant disc protrusion, foraminal stenosis or central
canal stenosis of the lumbar spine.

## 2018-04-06 ENCOUNTER — Other Ambulatory Visit: Payer: Self-pay

## 2018-04-06 MED ORDER — LISDEXAMFETAMINE DIMESYLATE 30 MG PO CAPS: 30.0000 mg | ORAL_CAPSULE | Freq: Every day | ORAL | 0 refills | Status: DC

## 2018-04-06 MED ORDER — LISDEXAMFETAMINE DIMESYLATE 50 MG PO CAPS: 50.0000 mg | ORAL_CAPSULE | Freq: Every day | ORAL | 0 refills | Status: DC

## 2018-04-06 NOTE — Telephone Encounter (Signed)
RX for above e-scribed and sent to pharmacy on record  [facility] - [place], [place] - [address] [place] [address] Phone: [phone] Fax: [phone]    

## 2018-04-06 NOTE — Telephone Encounter (Signed)
Mom called in for refills for Vyvanse  and . Last visit 03/10/2018. Please escribe to [facility]

## 2018-05-11 ENCOUNTER — Other Ambulatory Visit: Payer: Self-pay

## 2018-05-11 MED ORDER — LISDEXAMFETAMINE DIMESYLATE 30 MG PO CAPS: 30.0000 mg | ORAL_CAPSULE | Freq: Every day | ORAL | 0 refills | Status: DC

## 2018-05-11 MED ORDER — LISDEXAMFETAMINE DIMESYLATE 50 MG PO CAPS: 50.0000 mg | ORAL_CAPSULE | Freq: Every day | ORAL | 0 refills | Status: DC

## 2018-05-11 NOTE — Telephone Encounter (Signed)
RX for above e-scribed and sent to pharmacy on record  [facility] - [place], [place] - [address] [place] [address] Phone: [phone] Fax: [phone]    

## 2018-05-11 NOTE — Telephone Encounter (Signed)
Mom called in for refills for Vyvanse 30mg  and 50mg . Last visit 03/10/2018 next visit 06/08/2018. [facility]

## 2018-06-08 ENCOUNTER — Encounter: Payer: Self-pay | Admitting: Family

## 2018-06-08 ENCOUNTER — Ambulatory Visit (INDEPENDENT_AMBULATORY_CARE_PROVIDER_SITE_OTHER): Payer: 59 | Admitting: Family

## 2018-06-08 VITALS — BP 100/64 | HR 104 | Resp 16 | Ht 61.0 in | Wt 122.0 lb

## 2018-06-08 DIAGNOSIS — F411 Generalized anxiety disorder: Secondary | ICD-10-CM

## 2018-06-08 DIAGNOSIS — F819 Developmental disorder of scholastic skills, unspecified: Secondary | ICD-10-CM | POA: Diagnosis not present

## 2018-06-08 DIAGNOSIS — F419 Anxiety disorder, unspecified: Secondary | ICD-10-CM | POA: Diagnosis not present

## 2018-06-08 DIAGNOSIS — R000 Tachycardia, unspecified: Secondary | ICD-10-CM

## 2018-06-08 DIAGNOSIS — Z7189 Other specified counseling: Secondary | ICD-10-CM

## 2018-06-08 DIAGNOSIS — F902 Attention-deficit hyperactivity disorder, combined type: Secondary | ICD-10-CM | POA: Diagnosis not present

## 2018-06-08 DIAGNOSIS — Z719 Counseling, unspecified: Secondary | ICD-10-CM

## 2018-06-08 DIAGNOSIS — Z79899 Other long term (current) drug therapy: Secondary | ICD-10-CM

## 2018-06-08 MED ORDER — LISDEXAMFETAMINE DIMESYLATE 50 MG PO CAPS: 50.0000 mg | ORAL_CAPSULE | Freq: Every day | ORAL | 0 refills | Status: DC

## 2018-06-08 MED ORDER — LISDEXAMFETAMINE DIMESYLATE 30 MG PO CAPS: 30.0000 mg | ORAL_CAPSULE | Freq: Every day | ORAL | 0 refills | Status: DC

## 2018-06-08 MED ORDER — SERTRALINE HCL 100 MG PO TABS: 100.0000 mg | ORAL_TABLET | Freq: Every day | ORAL | 0 refills | Status: DC

## 2018-06-08 NOTE — Progress Notes (Signed)
[place] [facility] [place] [facility] [address] [place] [address] Dept: [phone] Dept Fax: [phone] Loc: [phone] Loc Fax: [phone]  Medical Follow-up  Patient ID: Kristine Edwards, female  DOB: [birth year], 22 y.o.  MRN: [id number]  Date of Evaluation: 06/08/2018  PCP: Gweneth DimitriMcNeill, Wendy, MD  Accompanied by: Self Patient Lives with: mother and father  HISTORY/CURRENT STATUS:  HPI  Patient here for routine follow up related to ADHD, Anxiety, learning problems, and medication management. Patient here by herself at today's visit and interactive with provider. Hyper-verabal about her dogs and various school updates provided at the visit. Taking one class this summer and doing well with completing her work. Taking 4 classes for the fall semester that she registered for at this point. Patient recently volunteered for [employer] with a pet adoption fair at [employer] Has continued to take her medication-Vyvanse 50 mg and 30 mg each daily along with Zoloft with no side effects daily.   EDUCATION: School: [employer]-1 class (sociology) Year/Grade: college Homework Time: not too much homework this summer Performance/Grades: average Services: IEP/504 Plan Activities/Exercise: intermittently  MEDICAL HISTORY: Appetite: ok MVI/Other: daily Fruits/Vegs:some Calcium: some Iron:some  Sleep: Bedtime: 2:00 am  Awakens: 8:00 or later, may take naps during the day.  Sleep Concerns: Initiation/Maintenance/Other: No problems   Individual Medical History/Review of System Changes? None reported recently.   Allergies: Patient has no known allergies.  Current Medications:  Current Outpatient Medications:  .  CORLANOR 7.5 MG TABS tablet, TAKE 1 TABLET BY MOUTH TWICE DAILY WITH A MEAL., Disp: 60 tablet, Rfl: 6 .  lisdexamfetamine (VYVANSE) 30 MG capsule, Take 1 capsule (30 mg total) by  mouth daily., Disp: 30 capsule, Rfl: 0 .  lisdexamfetamine (VYVANSE) 50 MG capsule, Take 1 capsule (50 mg total) by mouth daily., Disp: 30 capsule, Rfl: 0 .  sertraline (ZOLOFT) 100 MG tablet, Take 1 tablet (100 mg total) by mouth daily. Take 1 tab daily. 3 month supply., Disp: 90 tablet, Rfl: 0 .  Vitamin D, Ergocalciferol, (DRISDOL) 50000 units CAPS capsule, Take 1 capsule by mouth once a week., Disp: , Rfl:  Medication Side Effects: None  Family Medical/Social History Changes?: yes, father fractured his neck recently while trying to exercise.   MENTAL HEALTH: Mental Health Issues: Anxiety-controlled with Zoloft and decreased symptoms reported by patient.   PHYSICAL EXAM: Vitals:  Today's Vitals   06/08/18 1007  BP: 100/64  Pulse: (!) 104  Resp: 16  Weight: 122 lb (55.3 kg)  Height: 5\' 1"  (1.549 m)  PainSc: 0-No pain  , Facility age limit for growth percentiles is 20 years.  General Exam: Physical Exam  Constitutional: She is oriented to person, place, and time. She appears well-developed and well-nourished.  HENT:  Head: Normocephalic and atraumatic.  Right Ear: External ear normal.  Left Ear: External ear normal.  Nose: Nose normal.  Mouth/Throat: Oropharynx is clear and moist.  Eyes: Pupils are equal, round, and reactive to light. Conjunctivae and EOM are normal.  Corrective lenses  Neck: Normal range of motion. Neck supple.  Cardiovascular: Normal rate, regular rhythm, normal heart sounds and intact distal pulses.  Abdominal: Soft. Bowel sounds are normal.  Genitourinary:  Genitourinary Comments: Deferred  Musculoskeletal: Normal range of motion.  Neurological: She is alert and oriented to person, place, and time. She has normal reflexes.  Skin: Skin is warm and dry. Capillary refill takes less than 2 seconds.  Psychiatric:  She has a normal mood and affect. Her behavior is normal. Judgment and thought content normal.  Vitals reviewed.  Review of Systems    Psychiatric/Behavioral: Positive for decreased concentration and sleep disturbance. The patient is nervous/anxious.   All other systems reviewed and are negative.  Patient with no concerns for toileting. Daily stool, no constipation or diarrhea. Void urine no difficulty. No enuresis.   Participate in daily oral hygiene to include brushing and flossing.  Neurological: oriented to time, place, and person Cranial Nerves: normal  Neuromuscular:  Motor Mass: Normal  Tone: Normal  Strength: Normal  DTRs: 2+ and symmetric Overflow: None Reflexes: no tremors noted Sensory Exam: Vibratory: Intact  Fine Touch: Intact  Testing/Developmental Screens:  24/13 scored by patient and counseled  DIAGNOSES:    ICD-10-CM   1. Attention deficit hyperactivity disorder (ADHD), combined type F90.2 lisdexamfetamine (VYVANSE) 30 MG capsule    lisdexamfetamine (VYVANSE) 50 MG capsule  2. Anxiety F41.9   3. 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 Learning difficulty F81.9   5. Medication management Z79.899   6. Patient counseled Z71.9   7. Goals of care, counseling/discussion Z71.89   8. Generalized anxiety disorder F41.1 sertraline (ZOLOFT) 100 MG tablet    RECOMMENDATIONS: 3 month follow up and continuation of medication. Counseled on medication management. Vyvanse 50 mg and 30 mg daily, # 30 with no refills, Zoloft 100 mg daily, # 90 with no refills. RX for above e-scribed and sent to pharmacy on record  [facility] - [place], [place] [address] [place] [address] Phone: [phone] Fax: [phone]  Counseling at this visit included the review of old records and/or current chart with the patient with updates since last f/u visit.   Discussed recent history and today's examination with patient with no significant changes on examination today.   Counseled regarding college class, executive function and future employment with career  choices.   Recommended a high protein, low sugar diet for ADHD patients, watch portion sizes, avoid second helpings, avoid sugary snacks and drinks, drink more water, eat more fruits and vegetables, increase daily exercise.  Discussed school academic and behavioral progress and advocated for appropriate accommodations as needed for academic success.   Maintain Structure, routine, organization, reward, motivation and consequences with school and home environments.   Counseled medication administration, effects, and possible side effects with medication regimen.   Advised importance of:  Good sleep hygiene (8- 10 hours per night) Limited screen time (none on school nights, no more than 2 hours on weekends) Regular exercise(outside and active play) Healthy eating (drink water, no sodas/sweet tea, limit portions and no seconds).   Directed patient to f/u with PCP yearly, dentist every 6 months, Daily vitamins, healthy eating habits, more exercise, and better sleep routine.   NEXT APPOINTMENT: Return in about 3 months (around 09/08/2018) for follow up visit.  More than 50% of the appointment was spent counseling and discussing diagnosis and management of symptoms with the patient and family.  Carron Curie, NP Counseling Time: 30 mins Total Contact Time: 40 mins

## 2018-07-13 ENCOUNTER — Other Ambulatory Visit: Payer: Self-pay

## 2018-07-13 DIAGNOSIS — F902 Attention-deficit hyperactivity disorder, combined type: Secondary | ICD-10-CM

## 2018-07-13 NOTE — Telephone Encounter (Signed)
Mom called in for refills for Vyvanse 30mg and 50mg. Last visit 7/3/2019next visit 09/16/2018. Please escribe to [facility] 

## 2018-07-14 MED ORDER — LISDEXAMFETAMINE DIMESYLATE 30 MG PO CAPS: 30.0000 mg | ORAL_CAPSULE | Freq: Every day | ORAL | 0 refills | Status: DC

## 2018-07-14 MED ORDER — LISDEXAMFETAMINE DIMESYLATE 50 MG PO CAPS: 50.0000 mg | ORAL_CAPSULE | Freq: Every day | ORAL | 0 refills | Status: DC

## 2018-07-14 NOTE — Telephone Encounter (Signed)
RX for above e-scribed and sent to pharmacy on record  [facility] - [place], [place] - [address] [place] [address] Phone: [phone] Fax: [phone]    

## 2018-08-12 ENCOUNTER — Other Ambulatory Visit: Payer: Self-pay

## 2018-08-12 DIAGNOSIS — F902 Attention-deficit hyperactivity disorder, combined type: Secondary | ICD-10-CM

## 2018-08-12 MED ORDER — VYVANSE 50 MG PO CAPS: 50.0000 mg | ORAL_CAPSULE | Freq: Every day | ORAL | 0 refills | Status: DC

## 2018-08-12 MED ORDER — VYVANSE 30 MG PO CAPS: 30.0000 mg | ORAL_CAPSULE | Freq: Every day | ORAL | 0 refills | Status: DC

## 2018-08-12 NOTE — Telephone Encounter (Signed)
E-Prescribed Vyvanse 50 mg and Vyvanse 30 mg directly to  [facility] - [place], [place] [address] [place] [address] Phone: [phone] Fax: [phone]

## 2018-08-12 NOTE — Telephone Encounter (Signed)
Mom called in for refills for Vyvanse 30mg  and 50mg . Last visit 7/3/2019next visit 09/16/2018. Please escribe to [facility]

## 2018-09-09 ENCOUNTER — Other Ambulatory Visit: Payer: Self-pay | Admitting: Family

## 2018-09-09 DIAGNOSIS — F411 Generalized anxiety disorder: Secondary | ICD-10-CM

## 2018-09-09 NOTE — Telephone Encounter (Signed)
Zoloft 100 mg daily, # 30 with no refills.

## 2018-09-16 ENCOUNTER — Institutional Professional Consult (permissible substitution): Payer: 59 | Admitting: Family

## 2018-09-19 ENCOUNTER — Ambulatory Visit (INDEPENDENT_AMBULATORY_CARE_PROVIDER_SITE_OTHER): Payer: 59 | Admitting: Family

## 2018-09-19 ENCOUNTER — Encounter: Payer: Self-pay | Admitting: Family

## 2018-09-19 VITALS — BP 110/68 | HR 72 | Resp 16 | Ht 61.0 in | Wt 124.6 lb

## 2018-09-19 DIAGNOSIS — Z79899 Other long term (current) drug therapy: Secondary | ICD-10-CM

## 2018-09-19 DIAGNOSIS — F902 Attention-deficit hyperactivity disorder, combined type: Secondary | ICD-10-CM | POA: Diagnosis not present

## 2018-09-19 DIAGNOSIS — F819 Developmental disorder of scholastic skills, unspecified: Secondary | ICD-10-CM

## 2018-09-19 DIAGNOSIS — F411 Generalized anxiety disorder: Secondary | ICD-10-CM | POA: Diagnosis not present

## 2018-09-19 DIAGNOSIS — Z719 Counseling, unspecified: Secondary | ICD-10-CM

## 2018-09-19 MED ORDER — VYVANSE 30 MG PO CAPS: 30.0000 mg | ORAL_CAPSULE | Freq: Every day | ORAL | 0 refills | Status: DC

## 2018-09-19 MED ORDER — VYVANSE 50 MG PO CAPS: 50.0000 mg | ORAL_CAPSULE | Freq: Every day | ORAL | 0 refills | Status: DC

## 2018-09-19 MED ORDER — SERTRALINE HCL 100 MG PO TABS: 100.0000 mg | ORAL_TABLET | Freq: Every day | ORAL | 0 refills | Status: DC

## 2018-09-19 NOTE — Progress Notes (Signed)
[facility] [place] [facility] [address]  [place] [address] Dept: [phone] Dept Fax: [phone] Loc: [phone] Loc Fax: [phone]  Medical Follow-up  Patient ID: Kristine Edwards, female  DOB: 22-Jul-1996, 22 y.o.  MRN: [id number]  Date of Evaluation: 09/19/2018  PCP: Gweneth Dimitri, MD  Accompanied by: Self Patient Lives with: parents  HISTORY/CURRENT STATUS:  HPI  Patient here for routine follow up related to ADHD, Dysgraphia, Learning problems, Anxiety, and medication management. Patient here by herself at today's visit and interactive with provider at the visit. Patient has continued with school at [employer] this semester with taking 4 classes and doing well. Not working at this time and concentrating on school work. Has continued with Vyvanse 50 mg and 30 mg daily with Zoloft 100 mg daily without any side effects reported.   EDUCATION: School: [employer]-4 classes this semester Year/Grade: college  Homework Time: Depending on class demands Performance/Grades: above average Services: Other: Disability Services on campus Activities/Exercise: intermittently  MEDICAL HISTORY: Appetite: Good  MVI/Other: Stopped vitamin D and not taking Iron any more Fruits/Vegs:some Calcium: some Iron:some  Sleep: Bedtime: 1:00 am the latest Awakens: 8:00 am most days Sleep Concerns: Initiation/Maintenance/Other: no problems, napping during the day  Individual Medical History/Review of System Changes? None recently. Has f/u scheduled with cardiology routinely.   Allergies: Patient has no known allergies.  Current Medications:  Current Outpatient Medications:  .  CORLANOR 7.5 MG TABS tablet, TAKE 1 TABLET BY MOUTH TWICE DAILY WITH A MEAL., Disp: 60 tablet, Rfl: 6 .  sertraline (ZOLOFT) 100 MG tablet, Take 1 tablet (100 mg total) by mouth daily. 3 month supply, Disp:  90 tablet, Rfl: 0 .  VYVANSE 30 MG capsule, Take 1 capsule (30 mg total) by mouth daily., Disp: 30 capsule, Rfl: 0 .  VYVANSE 50 MG capsule, Take 1 capsule (50 mg total) by mouth daily., Disp: 30 capsule, Rfl: 0 .  Vitamin D, Ergocalciferol, (DRISDOL) 50000 units CAPS capsule, Take 1 capsule by mouth once a week., Disp: , Rfl:  Medication Side Effects: None  Family Medical/Social History Changes?: None reported   MENTAL HEALTH: Mental Health Issues: Anxiety-Zoloft 100 mg daily to control symptoms. No suicidal thoughts or ideations reported by patient.   PHYSICAL EXAM: Vitals:  Today's Vitals   09/19/18 1112  BP: 110/68  Pulse: 72  Resp: 16  Weight: 124 lb 9.6 oz (56.5 kg)  Height: 5\' 1"  (1.549 m)  PainSc: 0-No pain  , Facility age limit for growth percentiles is 20 years.  General Exam: Physical Exam  Constitutional: She is oriented to person, place, and time. She appears well-developed and well-nourished.  HENT:  Head: Normocephalic and atraumatic.  Right Ear: External ear normal.  Left Ear: External ear normal.  Nose: Nose normal.  Mouth/Throat: Oropharynx is clear and moist.  Eyes: Pupils are equal, round, and reactive to light. Conjunctivae and EOM are normal.  Corrective lenses  Neck: Normal range of motion. Neck supple.  Cardiovascular: Normal rate, regular rhythm, normal heart sounds and intact distal pulses.  Abdominal: Soft. Bowel sounds are normal.  Genitourinary:  Genitourinary Comments: Deferred  Musculoskeletal: Normal range of motion.  Neurological: She is alert and oriented to person, place, and time. She has normal reflexes.  Skin: Skin is warm and dry. Capillary refill takes less than 2 seconds.  Psychiatric: She has a normal mood and affect. Her behavior is normal. Judgment and thought content  normal.  Vitals reviewed.  Review of Systems  Psychiatric/Behavioral: Positive for decreased concentration. The patient is nervous/anxious.   All other systems  reviewed and are negative.  Patient with no concerns for toileting. Daily stool, no constipation or diarrhea. Void urine no difficulty. No enuresis.   Participate in daily oral hygiene to include brushing and flossing.  Neurological: oriented to time, place, and person Cranial Nerves: normal  Neuromuscular:  Motor Mass: Normal  Tone: Normal  Strength: Normal  DTRs: 2+ and symmetric Overflow: None Reflexes: no tremors noted Sensory Exam: Vibratory: Intact  Fine Touch: Intact  Testing/Developmental Screens:  ASRS-21/14 scored by patient today and counseled  DIAGNOSES:    ICD-10-CM   1. Attention deficit hyperactivity disorder (ADHD), combined type F90.2 VYVANSE 50 MG capsule    VYVANSE 30 MG capsule  2. Generalized anxiety disorder F41.1 sertraline (ZOLOFT) 100 MG tablet  3. Problems with learning F81.9   4. Patient counseled Z71.9   5. Medication management Z79.899     RECOMMENDATIONS: 3 month follow up and continuation of medication. Vyvanse 50 mg and 30 mg each daily, # 30 each daily with no refills. Zoloft 100 mg daily # 90 with no refills. RX for above e-scribed and sent to pharmacy on record  [facility] - [place], [place] - [place] [address] [place] [address] Phone: [phone] Fax: [phone]  Counseling at this visit included the review of old records and/or current chart with the patient with no changes reported today.   Discussed recent history and today's examination with patient with no changes on examination today.   Counseled regarding school and home environment with good habits.  Recommended a high protein, low sugar diet for ADHD patients, watch portion sizes, avoid second helpings, avoid sugary snacks and drinks, drink more water, eat more fruits and vegetables, increase daily exercise.  Discussed school academic and behavioral progress and advocated for appropriate accommodations as needed for learning  success.   Counseled medication pharmacokinetics, options, dosage, administration, desired effects, and possible side effects.    Advised importance of:  Good sleep hygiene (8- 10 hours per night, no TV or video games for 1 hour before bedtime) Limited screen time (none on school nights, no more than 2 hours/day on weekends, use of screen time for motivation) Regular exercise(outside and active play) Healthy eating (drink water or milk, no sodas/sweet tea, limit portions and no seconds).   Directed patient to f/u with PCP yearly, dentist every 6 months, MVI daily, healthy eating habits, more physical activities, and good sleep habits.   NEXT APPOINTMENT: Return in about 3 months (around 12/20/2018) for follow up visit.  More than 50% of the appointment was spent counseling and discussing diagnosis and management of symptoms with the patient and family.  Carron Curie, NP Counseling Time: 30 mins Total Contact Time: 40 mins

## 2018-10-07 DIAGNOSIS — J029 Acute pharyngitis, unspecified: Secondary | ICD-10-CM | POA: Diagnosis not present

## 2018-10-07 DIAGNOSIS — Z20828 Contact with and (suspected) exposure to other viral communicable diseases: Secondary | ICD-10-CM | POA: Diagnosis not present

## 2018-10-13 DIAGNOSIS — M25562 Pain in left knee: Secondary | ICD-10-CM | POA: Diagnosis not present

## 2018-10-13 DIAGNOSIS — M25561 Pain in right knee: Secondary | ICD-10-CM | POA: Diagnosis not present

## 2018-10-13 DIAGNOSIS — M222X1 Patellofemoral disorders, right knee: Secondary | ICD-10-CM | POA: Diagnosis not present

## 2018-10-14 ENCOUNTER — Other Ambulatory Visit: Payer: Self-pay

## 2018-10-14 DIAGNOSIS — F902 Attention-deficit hyperactivity disorder, combined type: Secondary | ICD-10-CM

## 2018-10-14 MED ORDER — VYVANSE 30 MG PO CAPS: 30.0000 mg | ORAL_CAPSULE | Freq: Every day | ORAL | 0 refills | Status: DC

## 2018-10-14 MED ORDER — VYVANSE 50 MG PO CAPS: 50.0000 mg | ORAL_CAPSULE | Freq: Every day | ORAL | 0 refills | Status: DC

## 2018-10-14 NOTE — Telephone Encounter (Signed)
Mom called in for refill for Vyvanse. Last visit 09/19/2018 next visit 12/13/2018. Please escribe to [facility] 

## 2018-10-14 NOTE — Telephone Encounter (Signed)
Vyvanse 50 mg and 30 mg each daily, # 30 each rx with no refills. RX for above e-scribed and sent to pharmacy on record  [facility] - [place], [place] [address] [place] [address] Phone: [phone] Fax: [phone]

## 2018-11-07 DIAGNOSIS — M25562 Pain in left knee: Secondary | ICD-10-CM | POA: Diagnosis not present

## 2018-11-07 DIAGNOSIS — M25561 Pain in right knee: Secondary | ICD-10-CM | POA: Diagnosis not present

## 2018-11-07 DIAGNOSIS — M6281 Muscle weakness (generalized): Secondary | ICD-10-CM | POA: Diagnosis not present

## 2018-11-14 DIAGNOSIS — M25561 Pain in right knee: Secondary | ICD-10-CM | POA: Diagnosis not present

## 2018-11-14 DIAGNOSIS — M6281 Muscle weakness (generalized): Secondary | ICD-10-CM | POA: Diagnosis not present

## 2018-11-14 DIAGNOSIS — M25562 Pain in left knee: Secondary | ICD-10-CM | POA: Diagnosis not present

## 2018-11-15 ENCOUNTER — Other Ambulatory Visit: Payer: Self-pay

## 2018-11-15 DIAGNOSIS — F902 Attention-deficit hyperactivity disorder, combined type: Secondary | ICD-10-CM

## 2018-11-15 NOTE — Telephone Encounter (Signed)
Mom called in for refill for Vyvanse. Last visit 09/19/2018 next visit 12/13/2018. [facility]

## 2018-11-16 DIAGNOSIS — M25562 Pain in left knee: Secondary | ICD-10-CM | POA: Diagnosis not present

## 2018-11-16 DIAGNOSIS — M6281 Muscle weakness (generalized): Secondary | ICD-10-CM | POA: Diagnosis not present

## 2018-11-16 DIAGNOSIS — M25561 Pain in right knee: Secondary | ICD-10-CM | POA: Diagnosis not present

## 2018-11-16 MED ORDER — VYVANSE 50 MG PO CAPS: 50.0000 mg | ORAL_CAPSULE | Freq: Every day | ORAL | 0 refills | Status: DC

## 2018-11-16 MED ORDER — VYVANSE 30 MG PO CAPS: 30.0000 mg | ORAL_CAPSULE | Freq: Every day | ORAL | 0 refills | Status: DC

## 2018-11-16 NOTE — Telephone Encounter (Signed)
Vyvanse 50 mg daily and 30 mg daily, # 30 with no refills. RX for above e-scribed and sent to pharmacy on record  [facility] [place] [place] [address] Phone: [phone] Fax: [phone]

## 2018-11-22 DIAGNOSIS — M25562 Pain in left knee: Secondary | ICD-10-CM | POA: Diagnosis not present

## 2018-11-22 DIAGNOSIS — M6281 Muscle weakness (generalized): Secondary | ICD-10-CM | POA: Diagnosis not present

## 2018-11-22 DIAGNOSIS — M25561 Pain in right knee: Secondary | ICD-10-CM | POA: Diagnosis not present

## 2018-11-24 DIAGNOSIS — M222X2 Patellofemoral disorders, left knee: Secondary | ICD-10-CM | POA: Diagnosis not present

## 2018-11-24 DIAGNOSIS — M222X1 Patellofemoral disorders, right knee: Secondary | ICD-10-CM | POA: Diagnosis not present

## 2018-12-01 DIAGNOSIS — M25561 Pain in right knee: Secondary | ICD-10-CM | POA: Diagnosis not present

## 2018-12-01 DIAGNOSIS — M25562 Pain in left knee: Secondary | ICD-10-CM | POA: Diagnosis not present

## 2018-12-01 DIAGNOSIS — M6281 Muscle weakness (generalized): Secondary | ICD-10-CM | POA: Diagnosis not present

## 2018-12-08 DIAGNOSIS — M25561 Pain in right knee: Secondary | ICD-10-CM | POA: Diagnosis not present

## 2018-12-08 DIAGNOSIS — M6281 Muscle weakness (generalized): Secondary | ICD-10-CM | POA: Diagnosis not present

## 2018-12-08 DIAGNOSIS — M25562 Pain in left knee: Secondary | ICD-10-CM | POA: Diagnosis not present

## 2018-12-13 ENCOUNTER — Encounter: Payer: 59 | Admitting: Family

## 2018-12-20 ENCOUNTER — Ambulatory Visit (INDEPENDENT_AMBULATORY_CARE_PROVIDER_SITE_OTHER): Payer: 59 | Admitting: Family

## 2018-12-20 ENCOUNTER — Encounter: Payer: Self-pay | Admitting: Family

## 2018-12-20 VITALS — BP 102/64 | HR 72 | Resp 16 | Ht 60.75 in | Wt 126.6 lb

## 2018-12-20 DIAGNOSIS — R278 Other lack of coordination: Secondary | ICD-10-CM

## 2018-12-20 DIAGNOSIS — Z719 Counseling, unspecified: Secondary | ICD-10-CM

## 2018-12-20 DIAGNOSIS — R000 Tachycardia, unspecified: Secondary | ICD-10-CM

## 2018-12-20 DIAGNOSIS — F419 Anxiety disorder, unspecified: Secondary | ICD-10-CM

## 2018-12-20 DIAGNOSIS — F411 Generalized anxiety disorder: Secondary | ICD-10-CM

## 2018-12-20 DIAGNOSIS — Z79899 Other long term (current) drug therapy: Secondary | ICD-10-CM

## 2018-12-20 DIAGNOSIS — F902 Attention-deficit hyperactivity disorder, combined type: Secondary | ICD-10-CM | POA: Diagnosis not present

## 2018-12-20 DIAGNOSIS — F819 Developmental disorder of scholastic skills, unspecified: Secondary | ICD-10-CM

## 2018-12-20 MED ORDER — VYVANSE 30 MG PO CAPS: 30.0000 mg | ORAL_CAPSULE | Freq: Every day | ORAL | 0 refills | Status: DC

## 2018-12-20 MED ORDER — SERTRALINE HCL 100 MG PO TABS: 100.0000 mg | ORAL_TABLET | Freq: Every day | ORAL | 0 refills | Status: DC

## 2018-12-20 MED ORDER — VYVANSE 50 MG PO CAPS: 50.0000 mg | ORAL_CAPSULE | Freq: Every day | ORAL | 0 refills | Status: DC

## 2018-12-20 NOTE — Progress Notes (Signed)
Patient ID: Kristine Edwards, female   DOB: [birth year], 23 y.o.   MRN: [id number] Medication Check  Patient ID: Kristine LikesCaelin Sara Stay  DOB: [birth year]  MRN: [id number]  DATE:12/20/18 Gweneth DimitriMcNeill, Wendy, MD  Accompanied by: Self Patient Lives with: parents  HISTORY/CURRENT STATUS: HPI  Patient here for routine follow up related to ADHD, Anxiety, Dysgraphia, Learning problems, and medication management. Patient here by herself for today's visit and interactive with provider. Patient cooperative with provider and appropriate at today's visit. Patient has continued with 4 classes this semester and only 2 more classes until completion of her 2 year degree. Patient looking at options of 4 year school, probably will attend [employer] for animal behavior. Patient has continued with Vyvanse 50 mg and 30 mg each daily on school days, has also continued Zoloft 100 mg daily with no side effects reported.   EDUCATION: School:[employer]     Year/Grade: 4th semester  4 classes this semester 2 more classes to complete her 2 year degree Disability Services on campus  MEDICAL HISTORY: Appetite: Good and probiotic, iron restarted and not taking Vitamin D  Sleep: Depends on the day and at times will nap and have some difficulty with sleep initiation. Now schedule getting back to normal after long winter break   Concerns: Initiation/Maintenance/Other: None  Individual Medical History/ Review of Systems: Changes? :Yes, had flu like symptoms and received Tamiflu due to mother being sick with the flu. Eye doctor recently with minor change and recent orthopedic visit for weak knees. Now sees PT and given exercises to do 3 times/weekly.   Family Medical/ Social History: Changes? None recently  Current Medications:  Zoloft and Vyvanse Medication Side Effects: None  MENTAL HEALTH: Mental Health Issues:  Anxiety Review of Systems  Psychiatric/Behavioral: Positive for decreased concentration. The patient is nervous/anxious.    All other systems reviewed and are negative.  Some concerns for toileting with increased iron vitamin. No daily stool, with some constipation, but no diarrhea. Void urine no difficulty. No enuresis.   Participate in daily oral hygiene to include brushing and flossing.  PHYSICAL EXAM; Vitals:   12/20/18 1422  BP: 102/64  Pulse: 72  Resp: 16  Weight: 126 lb 9.6 oz (57.4 kg)  Height: 5' 0.75" (1.543 m)   Body mass index is 24.12 kg/m.  General Physical Exam: Unchanged from previous exam, date: 09/19/18   Testing/Developmental Screens: CGI/ASRS = not completed Reviewed with patient and counseled at the visit  DIAGNOSES:    ICD-10-CM   1. Attention deficit hyperactivity disorder (ADHD), combined type F90.2 VYVANSE 30 MG capsule    VYVANSE 50 MG capsule  2. Anxiety F41.9   3. 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 Dysgraphia R27.8   5. Problems with learning F81.9   6. Patient counseled Z71.9   7. Medication management Z79.899   8. Generalized anxiety disorder F41.1 sertraline (ZOLOFT) 100 MG tablet    RECOMMENDATIONS:  3 month follow up and medication management. Patient to continue with Vyvanse 50 mg and 30 mg each daily, # 30 with no RF's each Rx, Zoloft 100 mg daily, # 90 with no RF's. RX for above e-scribed and sent to pharmacy on record  [facility] - [place] [place] [address] Phone: [phone] Fax: [phone]  Counseling at this visit included the review of old records and/or current chart with the patient with updates since last f/u visit. Patient provided recent healthcare visit information.  Discussed recent history and no changes reported by patient today.   Counseled patient with college classes, homework, peer relations and family relations. Support provided to patient today.   Recommended a high protein, low sugar diet for ADHD patients, watch portion sizes, avoid second  helpings, avoid sugary snacks and drinks, drink more water, eat more fruits and vegetables, increase daily exercise.  Discussed school academic and behavioral progress and advocated for appropriate accommodations as needed for academic success.   Discussed importance of maintaining structure, routine, organization, reward, motivation and consequences with consistency with school work and home environment.   Counseled medication pharmacokinetics, options, dosage, administration, desired effects, and possible side effects.    Advised importance of:  Good sleep hygiene (8- 10 hours per night, no TV or video games for 1 hour before bedtime) Limited screen time (none on school nights, no more than 2 hours/day on weekends, use of screen time for motivation) Regular exercise(outside and active play) Healthy eating (drink water or milk, no sodas/sweet tea, limit portions and no seconds).   Patient verbalized understanding of all topics discussed at the visit.   NEXT APPOINTMENT:  Return in about 3 months (around 03/21/2019) for follow up viist.  Medical Decision-making: More than 50% of the appointment was spent counseling and discussing diagnosis and management of symptoms with the patient and family.  Counseling Time: 25 minutes Total Contact Time: 30 minutes

## 2019-01-19 ENCOUNTER — Other Ambulatory Visit: Payer: Self-pay

## 2019-01-19 DIAGNOSIS — F902 Attention-deficit hyperactivity disorder, combined type: Secondary | ICD-10-CM

## 2019-01-19 MED ORDER — VYVANSE 30 MG PO CAPS: 30.0000 mg | ORAL_CAPSULE | Freq: Every day | ORAL | 0 refills | Status: DC

## 2019-01-19 MED ORDER — VYVANSE 50 MG PO CAPS: 50.0000 mg | ORAL_CAPSULE | Freq: Every day | ORAL | 0 refills | Status: DC

## 2019-01-19 NOTE — Telephone Encounter (Signed)
RX for above e-scribed and sent to pharmacy on record  [facility] - [place], [place] - [address] [place] [address] Phone: [phone] Fax: [phone]    

## 2019-01-19 NOTE — Telephone Encounter (Signed)
Mom called in for refill for Vyvanse. Last visit 12/20/2018 next visit 03/23/2019. Please escribe to [facility]

## 2019-02-15 ENCOUNTER — Other Ambulatory Visit: Payer: Self-pay

## 2019-02-15 MED ORDER — VYVANSE 50 MG PO CAPS: 50.0000 mg | ORAL_CAPSULE | Freq: Every day | ORAL | 0 refills | Status: DC

## 2019-02-15 MED ORDER — VYVANSE 30 MG PO CAPS: 30.0000 mg | ORAL_CAPSULE | Freq: Every day | ORAL | 0 refills | Status: DC

## 2019-02-15 NOTE — Telephone Encounter (Signed)
Mom called in for refill for Vyvanse. Last visit 12/20/2018 next visit 03/23/2019. Please escribe to [facility]

## 2019-02-15 NOTE — Telephone Encounter (Signed)
E-Prescribed Vyvanse 50 and 30 directly to  [facility] - [place] - [address] [place]  [address] Phone: [phone] Fax: [phone]    

## 2019-02-26 ENCOUNTER — Other Ambulatory Visit: Payer: Self-pay | Admitting: Cardiovascular Disease

## 2019-02-26 DIAGNOSIS — R000 Tachycardia, unspecified: Secondary | ICD-10-CM

## 2019-03-23 ENCOUNTER — Other Ambulatory Visit: Payer: Self-pay

## 2019-03-23 ENCOUNTER — Encounter: Payer: Self-pay | Admitting: Family

## 2019-03-23 ENCOUNTER — Ambulatory Visit (INDEPENDENT_AMBULATORY_CARE_PROVIDER_SITE_OTHER): Payer: 59 | Admitting: Family

## 2019-03-23 DIAGNOSIS — R278 Other lack of coordination: Secondary | ICD-10-CM

## 2019-03-23 DIAGNOSIS — F819 Developmental disorder of scholastic skills, unspecified: Secondary | ICD-10-CM

## 2019-03-23 DIAGNOSIS — R000 Tachycardia, unspecified: Secondary | ICD-10-CM

## 2019-03-23 DIAGNOSIS — Z79899 Other long term (current) drug therapy: Secondary | ICD-10-CM

## 2019-03-23 DIAGNOSIS — F902 Attention-deficit hyperactivity disorder, combined type: Secondary | ICD-10-CM

## 2019-03-23 DIAGNOSIS — F411 Generalized anxiety disorder: Secondary | ICD-10-CM

## 2019-03-23 DIAGNOSIS — Z719 Counseling, unspecified: Secondary | ICD-10-CM

## 2019-03-23 MED ORDER — VYVANSE 50 MG PO CAPS: 50.0000 mg | ORAL_CAPSULE | Freq: Every day | ORAL | 0 refills | Status: DC

## 2019-03-23 MED ORDER — SERTRALINE HCL 100 MG PO TABS: 100.0000 mg | ORAL_TABLET | Freq: Every day | ORAL | 0 refills | Status: DC

## 2019-03-23 MED ORDER — VYVANSE 30 MG PO CAPS: 30.0000 mg | ORAL_CAPSULE | Freq: Every day | ORAL | 0 refills | Status: DC

## 2019-03-23 NOTE — Progress Notes (Signed)
[facility] [address] [place] [address] Dept: [phone] Dept Fax: [phone]  Medication Check visit via Virtual Video due to COVID-19  Patient ID:  Kristine Edwards  female DOB: 16-May-1996   22 y.o.   MRN: [id number]   DATE:03/23/19  PCP: Gweneth DimitriMcNeill, Wendy, MD  Virtual Visit via Video Note  I connected with  Kristine Edwards  's Mother (Name Kristine Edwards) on 03/23/19 at  2:00 PM EDT by a video enabled telemedicine application and verified that I am speaking with the correct person using two identifiers.   I discussed the limitations of evaluation and management by telemedicine and the availability of in person appointments. The patient & parent expressed understanding and agreed to proceed.  Parent Location: at home Provider Locations: private residence  HISTORY/CURRENT STATUS: Kristine Edwards is here for medication management of the psychoactive medications for ADHD and review of educational and behavioral concerns.  Kristine Edwards currently taking Vyvanse and Zoloft, which is working well. Takes medication at 11:00 am. Medication tends to wear off around 6-7:00 pm. Kristine Edwards is able to focus through homework.   Kristine Edwards is eating well (eating breakfast, lunch and dinner). No changes recently reported.  Sleeping well (goes to bed at 2:00 am wakes at 11:00 am), sleeping through the night.   Kristine Edwards denies thoughts of hurting self or others, denies depression, anxiety, or fears. Anxiety has been in control with no issues reported.   EDUCATION: School: [employer]  Year/Grade: college  Performance/ Grades: average Services: Other: [employer]ability [employer] Fall classes with only 4 more to take for graduation  Kristine Edwards is currently out of school due to social distancing due to COVID-19 and online schooling for the remainder of the year.   Activities/ Exercise: intermittently-walking the dog.   Screen time: (phone,  tablet, TV, computer): online classes and TV   MEDICAL HISTORY: Individual Medical History/ Review of Systems: Changes? :None reported recently.   Family Medical/ Social History: Changes? None reported by patient  Current Medications:  Outpatient Encounter Medications as of 03/23/2019  Medication Sig  . VYVANSE 30 MG capsule Take 1 capsule (30 mg total) by mouth daily.  . [DISCONTINUED] VYVANSE 30 MG capsule Take 1 capsule (30 mg total) by mouth daily.  . ivabradine (CORLANOR) 7.5 MG TABS tablet Take 1 tablet (7.5 mg total) by mouth 2 (two) times daily. NEED OV.  . sertraline (ZOLOFT) 100 MG tablet Take 1 tablet (100 mg total) by mouth daily. 3 month supply  . VYVANSE 50 MG capsule Take 1 capsule (50 mg total) by mouth daily.  . [DISCONTINUED] sertraline (ZOLOFT) 100 MG tablet Take 1 tablet (100 mg total) by mouth daily. 3 month supply  . [DISCONTINUED] VYVANSE 50 MG capsule Take 1 capsule (50 mg total) by mouth daily.   No facility-administered encounter medications on file as of 03/23/2019.    Medication Side Effects: None  MENTAL HEALTH: Mental Health Issues:   Anxiety-less now, but was increased with change to all online schooling.   DIAGNOSES:    ICD-10-CM   1. Attention deficit hyperactivity disorder (ADHD), combined type F90.2   2. Generalized anxiety disorder F41.1 sertraline (ZOLOFT) 100 MG tablet  3. 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 Dysgraphia R27.8   5. Problems with learning F81.9   6. Medication management Z79.899   7. Patient counseled Z71.9     RECOMMENDATIONS:  Discussed recent history and updates for health and education with patient.  Discussed school academic progress and appropriate accommodations as needed for learning progress.   Discussed continued need for routine, structure, motivation, reward and positive reinforcement with patient for learning in the home setting.  Encouraged recommended limitations on TV, tablets, phones, video games and  computers for non-educational activities.   Encouraged physical activity and outdoor play, maintaining social distancing.   Counseled medication pharmacokinetics, options, dosage, administration, desired effects, and possible side effects.   Vyvanse 50 mg and 30 mg daily, # 30 with no RF's and Zoloft 100 mg daily, # 90 with no RF's. RX for above e-scribed and sent to pharmacy on record  [facility] - [place], [place] - [place] [address] [place] [address] Phone: [phone] Fax: [phone]  I discussed the assessment and treatment plan with the patient & parent. The patient & parent was provided an opportunity to ask questions and all were answered. The patient & parent agreed with the plan and demonstrated an understanding of the instructions.   I provided 40 minutes of non-face-to-face time during this encounter. Record review for 10 minutes prior to virtual visit.   NEXT APPOINTMENT:  Return in about 3 months (around 06/22/2019) for follow up visit.  The patient & parent was advised to call back or seek an in-person evaluation if the symptoms worsen or if the condition fails to improve as anticipated.  Medical Decision-making: More than 50% of the appointment was spent counseling and discussing diagnosis and management of symptoms with the patient and family.  Carron Curie, NP

## 2019-05-08 ENCOUNTER — Other Ambulatory Visit: Payer: Self-pay

## 2019-05-08 MED ORDER — VYVANSE 30 MG PO CAPS: 30.0000 mg | ORAL_CAPSULE | Freq: Every day | ORAL | 0 refills | Status: DC

## 2019-05-08 MED ORDER — VYVANSE 50 MG PO CAPS: 50.0000 mg | ORAL_CAPSULE | Freq: Every day | ORAL | 0 refills | Status: DC

## 2019-05-08 NOTE — Telephone Encounter (Signed)
Mom called in for refill for Vyvanse. Last visit  03/23/2019. Please escribe to [facility]

## 2019-05-08 NOTE — Telephone Encounter (Signed)
E-Prescribed Vyvanse 50 and 30 directly to  [facility] - [place], [place] - [address] [place]  [address] Phone: [phone] Fax: [phone]    

## 2019-06-09 ENCOUNTER — Other Ambulatory Visit: Payer: Self-pay | Admitting: Family

## 2019-06-09 DIAGNOSIS — F411 Generalized anxiety disorder: Secondary | ICD-10-CM

## 2019-06-12 NOTE — Telephone Encounter (Signed)
Zoloft 100 mg daily, # 30 with 2 RF's. RX for above e-scribed and sent to pharmacy on record   [place], [place] [address] Phone: [phone] Fax: [phone]

## 2019-07-03 ENCOUNTER — Encounter: Payer: Self-pay | Admitting: Family

## 2019-07-03 ENCOUNTER — Ambulatory Visit (INDEPENDENT_AMBULATORY_CARE_PROVIDER_SITE_OTHER): Payer: 59 | Admitting: Family

## 2019-07-03 ENCOUNTER — Other Ambulatory Visit: Payer: Self-pay

## 2019-07-03 DIAGNOSIS — F902 Attention-deficit hyperactivity disorder, combined type: Secondary | ICD-10-CM

## 2019-07-03 DIAGNOSIS — Z79899 Other long term (current) drug therapy: Secondary | ICD-10-CM

## 2019-07-03 DIAGNOSIS — R0602 Shortness of breath: Secondary | ICD-10-CM | POA: Diagnosis not present

## 2019-07-03 DIAGNOSIS — R000 Tachycardia, unspecified: Secondary | ICD-10-CM | POA: Diagnosis not present

## 2019-07-03 DIAGNOSIS — Z719 Counseling, unspecified: Secondary | ICD-10-CM

## 2019-07-03 DIAGNOSIS — F419 Anxiety disorder, unspecified: Secondary | ICD-10-CM

## 2019-07-03 DIAGNOSIS — I4711 Inappropriate sinus tachycardia, so stated: Secondary | ICD-10-CM

## 2019-07-03 MED ORDER — VYVANSE 50 MG PO CAPS: 50.0000 mg | ORAL_CAPSULE | Freq: Every day | ORAL | 0 refills | Status: DC

## 2019-07-03 MED ORDER — VYVANSE 30 MG PO CAPS: 30.0000 mg | ORAL_CAPSULE | Freq: Every day | ORAL | 0 refills | Status: DC

## 2019-07-03 NOTE — Progress Notes (Signed)
[facility] [place]. [address] [place] [address] Dept: [phone] Dept Fax: [phone]  Medication Check visit via Virtual Video due to COVID-19  Patient ID:  Kristine Edwards  female DOB: 1996-06-08   23 y.o.   MRN: [id number]   DATE:07/03/19  PCP: Cari Caraway, MD  Virtual Visit via Video Note  I connected with  Kristine Edwards on 07/03/19 at  2:00 PM EDT by a video enabled telemedicine application and verified that I am speaking with the correct person using two identifiers. Patient/Parent Location: at home   I discussed the limitations, risks, security and privacy concerns of performing an evaluation and management service by telephone and the availability of in person appointments. I also discussed with the parents that there may be a patient responsible charge related to this service. The parents expressed understanding and agreed to proceed.  Provider: Carolann Littler, NP  Location: private location  HISTORY/CURRENT STATUS: Kristine Edwards is here for medication management of the psychoactive medications for ADHD and review of educational and behavioral concerns.   Kristine Edwards currently taking medication regimen, which is working well. Takes medication at 10-11 am. Medication tends to wear off around dinner time. Kristine Edwards is able to focus through school/homework/chores.   Kristine Edwards is eating well (eating breakfast, lunch and dinner). Eating with no issues.   Sleeping well (getting enough sleep), sleeping through the night.   EDUCATION: School: [employer]  Year/Grade: college  Performance/ Grades: average Services: IEP/504 Plan  Kristine Edwards was out of school due to social distancing due to COVID-19 and participated in a home schooling program. Online for the first semester.   Activities/ Exercise: intermittently walking more recently  Screen time: (phone, tablet, TV, computer): increased amount  of time.   MEDICAL HISTORY: Individual Medical History/ Review of Systems: Changes? :None recently reported with health. Recent ear pain and prescribed medication for the pain.   Family Medical/ Social History: Changes? None  Patient Lives with: parents  Current Medications:  Outpatient Encounter Medications as of 07/03/2019  Medication Sig  . ivabradine (CORLANOR) 7.5 MG TABS tablet Take 1 tablet (7.5 mg total) by mouth 2 (two) times daily. NEED OV.  . sertraline (ZOLOFT) 100 MG tablet TAKE 1 TABLET ONCE DAILY.  [name] VYVANSE 30 MG capsule Take 1 capsule (30 mg total) by mouth daily.  [name] VYVANSE 50 MG capsule Take 1 capsule (50 mg total) by mouth daily.  . [DISCONTINUED] VYVANSE 30 MG capsule Take 1 capsule (30 mg total) by mouth daily.  . [DISCONTINUED] VYVANSE 50 MG capsule Take 1 capsule (50 mg total) by mouth daily.   No facility-administered encounter medications on file as of 07/03/2019.    Medication Side Effects: None  MENTAL HEALTH: Mental Health Issues:   Anxiety    DIAGNOSES:    ICD-10-CM   1. Attention deficit hyperactivity disorder (ADHD), combined type  F90.2   2. Anxiety  F41.9   3. Shortness of breath  R06.02   4. Inappropriate sinus tachycardia  R00.0   5. Medication management  Z79.899   6. Patient counseled  Z71.9     RECOMMENDATIONS:  Discussed recent history with patient with updates on health, learning and school since last f/u visit.  Discussed school academic progress and recommended continued academic home school activities using appropriate accommodations as needed for success.   Discussed continued need for routine, structure, and motivation with school and living at home.    Encouraged recommended  limitations on TV, tablets, phones, video games and computers for non-educational activities.   Discussed need for bedtime routine, use of good sleep hygiene, no video games, TV or phones for an hour before bedtime.   Encouraged physical activity and  outdoor play, maintaining social distancing.   Counseled medication pharmacokinetics, options, dosage, administration, desired effects, and possible side effects.   Zoloft 100 ng daily, no Rx today Vyvanse 50 mg daily, # 30 with no RF's Vyvanse 30 mg daily # 30 with no RF's  RX for above e-scribed and sent to pharmacy on record  [facility] - [place] [place] [address] Phone: [phone] Fax: [phone]  I discussed the assessment and treatment plan with the patient. The patient was provided an opportunity to ask questions and all were answered. The patient agreed with the plan and demonstrated an understanding of the instructions.   I provided 25 minutes of non-face-to-face time during this encounter. Completed record review for 10 minutes prior to the virtual video visit.   NEXT APPOINTMENT:  Return in about 3 months (around 10/03/2019) for follow up visit.  The patient was advised to call back or seek an in-person evaluation if the symptoms worsen or if the condition fails to improve as anticipated.  Medical Decision-making: More than 50% of the appointment was spent counseling and discussing diagnosis and management of symptoms with the patient and family.  Carron Curieawn M Paretta-Lea[place], NP

## 2019-07-05 ENCOUNTER — Telehealth: Payer: Self-pay

## 2019-07-05 NOTE — Telephone Encounter (Signed)
Verified home address with mom 

## 2019-08-02 ENCOUNTER — Other Ambulatory Visit: Payer: Self-pay

## 2019-08-02 MED ORDER — VYVANSE 30 MG PO CAPS: 30.0000 mg | ORAL_CAPSULE | Freq: Every day | ORAL | 0 refills | Status: DC

## 2019-08-02 MED ORDER — VYVANSE 50 MG PO CAPS: 50.0000 mg | ORAL_CAPSULE | Freq: Every day | ORAL | 0 refills | Status: DC

## 2019-08-02 NOTE — Telephone Encounter (Signed)
RX for above e-scribed and sent to pharmacy on record  [facility] - [place], [place] - [address] [place] [address] Phone: [phone] Fax: [phone]    

## 2019-08-02 NOTE — Telephone Encounter (Signed)
Mom called in for refill for Vyvanse. Last visit 07/03/2019 next visit 09/11/2019. Please escribe to [facility]

## 2019-09-11 ENCOUNTER — Other Ambulatory Visit: Payer: Self-pay

## 2019-09-11 ENCOUNTER — Encounter: Payer: Self-pay | Admitting: Family

## 2019-09-11 ENCOUNTER — Ambulatory Visit (INDEPENDENT_AMBULATORY_CARE_PROVIDER_SITE_OTHER): Payer: 59 | Admitting: Family

## 2019-09-11 DIAGNOSIS — R000 Tachycardia, unspecified: Secondary | ICD-10-CM | POA: Diagnosis not present

## 2019-09-11 DIAGNOSIS — Z7189 Other specified counseling: Secondary | ICD-10-CM

## 2019-09-11 DIAGNOSIS — F902 Attention-deficit hyperactivity disorder, combined type: Secondary | ICD-10-CM

## 2019-09-11 DIAGNOSIS — Z79899 Other long term (current) drug therapy: Secondary | ICD-10-CM

## 2019-09-11 DIAGNOSIS — F819 Developmental disorder of scholastic skills, unspecified: Secondary | ICD-10-CM | POA: Diagnosis not present

## 2019-09-11 DIAGNOSIS — R278 Other lack of coordination: Secondary | ICD-10-CM

## 2019-09-11 DIAGNOSIS — F411 Generalized anxiety disorder: Secondary | ICD-10-CM

## 2019-09-11 DIAGNOSIS — Z719 Counseling, unspecified: Secondary | ICD-10-CM

## 2019-09-11 MED ORDER — VYVANSE 30 MG PO CAPS: 30.0000 mg | ORAL_CAPSULE | Freq: Every day | ORAL | 0 refills | Status: DC

## 2019-09-11 MED ORDER — SERTRALINE HCL 100 MG PO TABS: 100.0000 mg | ORAL_TABLET | Freq: Every day | ORAL | 2 refills | Status: DC

## 2019-09-11 MED ORDER — VYVANSE 50 MG PO CAPS: 50.0000 mg | ORAL_CAPSULE | Freq: Every day | ORAL | 0 refills | Status: DC

## 2019-09-11 NOTE — Progress Notes (Signed)
[facility] [place]. [address] [place] [address] Dept: [phone] Dept Fax: [phone]  Medication Check visit via Virtual Video due to COVID-19  Patient ID:  Kristine Edwards  female DOB: [birth year]   23 y.o.   MRN: [id number]   DATE:09/11/19  PCP: Kristine Caraway, MD  Virtual Visit via Video Note  I connected with  Kristine Edwards on 09/11/19 at 10:30 AM EDT by a video enabled telemedicine application and verified that I am speaking with the correct person using two identifiers. Patient/Parent Location: home   I discussed the limitations, risks, security and privacy concerns of performing an evaluation and management service by telephone and the availability of in person appointments. I also discussed with the parents that there may be a patient responsible charge related to this service. The parents expressed understanding and agreed to proceed.  Provider: Carolann Littler, NP  Location: private location  HISTORY/CURRENT STATUS: Kristine Edwards is here for medication management of the psychoactive medications for ADHD and review of educational and behavioral concerns.   Kristine Edwards currently taking medication regimen each day, which is working well. Takes medication most days in the morning with breakfast. Medication tends to wear off around evening with her Vyvanse. Kristine Edwards is able to focus through [employer] each day.   Kristine Edwards is eating well (eating breakfast, lunch and dinner). No changes recently reported.   Sleeping well (getting plenty of sleep), sleeping through the night.   EDUCATION: School: [employer]  Year/Grade: last semester at [employer] will transfer to [employer] Performance/ Grades: average Services: IEP/504 Plan  Kristine Edwards is currently in distance learning due to social distancing due to COVID-19 and will continue for at least: this semester.   Activities/ Exercise:  intermittently-walking Screen time: (phone, tablet, TV, computer): computer for learning, TV, movies and phone   MEDICAL HISTORY: Individual Medical History/ Review of Systems: Changes? :None recently reported. Needs to get flu vaccine. Cardiology in the next month or so.   Family Medical/ Social History: Changes? None reported.  Patient Lives with: parents  Current Medications:  Outpatient Encounter Medications as of 09/11/2019  Medication Sig  . ivabradine (CORLANOR) 7.5 MG TABS tablet Take 1 tablet (7.5 mg total) by mouth 2 (two) times daily. NEED OV.  . sertraline (ZOLOFT) 100 MG tablet Take 1 tablet (100 mg total) by mouth daily.  [name] VYVANSE 30 MG capsule Take 1 capsule (30 mg total) by mouth daily.  [name] VYVANSE 50 MG capsule Take 1 capsule (50 mg total) by mouth daily.  . [DISCONTINUED] sertraline (ZOLOFT) 100 MG tablet TAKE 1 TABLET ONCE DAILY.  . [DISCONTINUED] VYVANSE 30 MG capsule Take 1 capsule (30 mg total) by mouth daily.  . [DISCONTINUED] VYVANSE 50 MG capsule Take 1 capsule (50 mg total) by mouth daily.   No facility-administered encounter medications on file as of 09/11/2019.    Medication Side Effects: None  MENTAL HEALTH: Mental Health Issues:   Anxiety-doing well with less anxiety due to continued Zoloft.   DIAGNOSES:    ICD-10-CM   1. Attention deficit hyperactivity disorder (ADHD), combined type  F90.2   2. Generalized anxiety disorder  F41.1 sertraline (ZOLOFT) 100 MG tablet  3. Inappropriate sinus tachycardia  R00.0   4. Learning difficulty  F81.9   5. Dysgraphia  R27.8   6. Medication management  Z79.899   7. Patient counseled  Z71.9   8. Goals of care, counseling/discussion  Z71.89  RECOMMENDATIONS:  Discussed recent history with patient with updates for health, learning, school, and medication management.   Discussed school academic progress and recommended continued accommodations for the new school year.  Referred to [employer] for resources  about using distance learning with children with ADHD for continued learning support.   Children and young adults with ADHD often suffer from disorganization, difficulty with time management, completing projects and other executive function difficulties.  Recommended Reading: "Smart but Scattered" and "Smart but Scattered Teens" by Peg Arita Miss and Marjo Bicker.    Discussed continued need for structure, routine, reward (external), motivation (internal), positive reinforcement, consequences, and organization for health and learning.   Encouraged recommended limitations on TV, tablets, phones, video games and computers for non-educational activities.   Discussed need for bedtime routine, use of good sleep hygiene, no video games, TV or phones for an hour before bedtime.   Encouraged physical activity and outdoor play, maintaining social distancing.   Counseled medication pharmacokinetics, options, dosage, administration, desired effects, and possible side effects.   Vyvanse 50 mg, # 30 with no RF"s Vyvanse 30 mg daily, # 30 with no RF's Zoloft 100 mg daily, # 30 with 2 RF's RX for above e-scribed and sent to pharmacy on record  [facility] - [place], [place] - [place] [address] [place] [address] Phone: [phone] Fax: [phone]   I discussed the assessment and treatment plan with the patient & parent. The patient & parent was provided an opportunity to ask questions and all were answered. The patient & parent agreed with the plan and demonstrated an understanding of the instructions.   I provided 25 minutes of non-face-to-face time during this encounter. Completed record review for 10 minutes prior to the virtual video visit.   NEXT APPOINTMENT:  Return in about 3 months (around 12/12/2019) for follow up visit.  The patient & parent was advised to call back or seek an in-person evaluation if the symptoms worsen or if the condition fails to  improve as anticipated.  Medical Decision-making: More than 50% of the appointment was spent counseling and discussing diagnosis and management of symptoms with the patient and family.  Carron Curie, NP

## 2019-09-13 ENCOUNTER — Telehealth: Payer: Self-pay

## 2019-09-13 NOTE — Telephone Encounter (Signed)
Pharm faxed in Prior Auth for Vyvanse. Last visit 09/11/2019. Submitting Prior Auth to [facility]

## 2019-09-13 NOTE — Telephone Encounter (Signed)
Outcome Approvedtoday Request Reference Number: [id number]. VYVANSE CAP 50MG  is approved through 09/12/2020. For further questions, call [phone].

## 2019-09-17 ENCOUNTER — Other Ambulatory Visit: Payer: Self-pay | Admitting: Cardiovascular Disease

## 2019-09-17 DIAGNOSIS — R000 Tachycardia, unspecified: Secondary | ICD-10-CM

## 2019-10-24 ENCOUNTER — Telehealth: Payer: Self-pay

## 2019-10-24 MED ORDER — VYVANSE 50 MG PO CAPS: 50.0000 mg | ORAL_CAPSULE | Freq: Every day | ORAL | 0 refills | Status: DC

## 2019-10-24 MED ORDER — VYVANSE 30 MG PO CAPS: 30.0000 mg | ORAL_CAPSULE | Freq: Every day | ORAL | 0 refills | Status: DC

## 2019-10-24 NOTE — Telephone Encounter (Signed)
RX for above e-scribed and sent to pharmacy on record  [facility] - [place], [place] - [address] [place] [address] Phone: [phone] Fax: [phone]    

## 2019-10-24 NOTE — Telephone Encounter (Signed)
Mom called in for refill for Vyvanse. Last visit 09/11/2019. Please escribe to [facility] 

## 2019-11-06 ENCOUNTER — Ambulatory Visit (INDEPENDENT_AMBULATORY_CARE_PROVIDER_SITE_OTHER): Payer: 59 | Admitting: Cardiovascular Disease

## 2019-11-06 ENCOUNTER — Other Ambulatory Visit: Payer: Self-pay

## 2019-11-06 ENCOUNTER — Encounter: Payer: Self-pay | Admitting: Cardiovascular Disease

## 2019-11-06 VITALS — BP 110/78 | HR 108 | Ht 60.0 in | Wt 143.0 lb

## 2019-11-06 DIAGNOSIS — R000 Tachycardia, unspecified: Secondary | ICD-10-CM | POA: Diagnosis not present

## 2019-11-06 MED ORDER — IVABRADINE HCL 7.5 MG PO TABS: ORAL_TABLET | ORAL | 3 refills | Status: DC

## 2019-11-06 NOTE — Progress Notes (Signed)
Cardiology Office Note   Date:  11/06/2019   ID:  Kristine Edwards, DOB 1996-01-14, MRN [id number]  PCP:  Kristine Dimitri, MD  Cardiologist:   Kristine Si, MD   No chief complaint on file.   Patient ID: Kristine Edwards is a 23 y.o. female with inappropriate sinus tachycardia, PFO, and ADHD who presents for follow-up.  She initially was noted to have inappropriate heart rates in December 2016 when she presented to the emergency department with a fractured elbow.  At that time her heart rate was ranging from 115 to 128.  Her heart rate has been elevated since at least middle school and she's noted exercise intolerance.   [name] was evaluated by a pediatric cardiologist in 2014. She was tachycardic at the time and wore two Holter monitors. The first was while taking Vyvanse and the second occurred two days later after holding the medication.  Reportedly, there was no change in her tachycardia.  The ambulatory monitoring and echocardiogram were reportedly unremarkable other than tachycardia and a patent foramen ovale.  In January 2017 she had an echo that revealed LVEF 60-65% and no other significant abnormalities.  [name] had lab work that revealed normal kidney function, thyroid function and electrolytes.  She was started on metorpolol but there was no improvement in her symptoms.  She was started on ivabradine and it was increased to 7.5 mg.    [name] has been doing well.  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  She has been taking classes online due to COVID-19.  She has been gaining weight which she attributes to being physically inactive now that she does not go to campus for school.  She does not get any formal exercise.  She denies palpitations, presyncope or syncope.  She has been out of her ivabradine for the last week, but her heart rate is typically well-controlled when she takes it.  She reports that her mood has been stable.  She has no exertional  chest pain or shortness of breath.  She denies lower extremity edema, orthopnea, or PND.   Past Medical History:  Diagnosis Date  . ADD (attention deficit disorder) 12/17/2015  . ADHD (attention deficit hyperactivity disorder)   . Anxiety 12/17/2015  . Inappropriate sinus tachycardia 02/17/2016  . Shortness of breath 12/17/2015  . Sinus tachycardia 12/17/2015    Past Surgical History:  Procedure Laterality Date  . ADENOIDECTOMY    . WISDOM TOOTH EXTRACTION       Current Outpatient Medications  Medication Sig Dispense Refill  . ivabradine (CORLANOR) 7.5 MG TABS tablet TAKE 1 TABLET(7.5 MG) BY MOUTH TWICE DAILY 180 tablet 3  . sertraline (ZOLOFT) 100 MG tablet Take 1 tablet (100 mg total) by mouth daily. 30 tablet 2  . VYVANSE 30 MG capsule Take 1 capsule (30 mg total) by mouth daily. 30 capsule 0  . VYVANSE 50 MG capsule Take 1 capsule (50 mg total) by mouth daily. 30 capsule 0   No current facility-administered medications for this visit.     Allergies:   Patient has no known allergies.    Social History:  The patient  reports that she has never smoked. She has never used smokeless tobacco.   Family History:  The patient's family history includes Cancer in her maternal grandfather; Heart disease in her maternal grandfather; Other in her father; Seizures in her maternal grandmother; Stroke in her paternal grandfather.    ROS:  Please see the  history of present illness.   Otherwise, review of systems are positive for none.   All other systems are reviewed and negative.    PHYSICAL EXAM: VS:  BP 110/78   Pulse (!) 108   Ht 5' (1.524 m)   Wt 143 lb (64.9 kg)   SpO2 97%   BMI 27.93 kg/m  , BMI Body mass index is 27.93 kg/m. GENERAL:  Well appearing HEENT: Pupils equal round and reactive, fundi not visualized, oral mucosa unremarkable NECK:  No jugular venous distention, waveform within normal limits, carotid upstroke brisk and symmetric, no bruits, no thyromegaly  LYMPHATICS:  No cervical adenopathy LUNGS:  Clear to auscultation bilaterally HEART:  Tachycardic.  Regular rhythm.   PMI not displaced or sustained,S1 and S2 within normal limits, no S3, no S4, no clicks, no rubs, no murmurs ABD:  Flat, positive bowel sounds normal in frequency in pitch, no bruits, no rebound, no guarding, no midline pulsatile mass, no hepatomegaly, no splenomegaly EXT:  2 plus pulses throughout, no edema, no cyanosis no clubbing SKIN:  No rashes no nodules NEURO:  Cranial nerves II through XII grossly intact, motor grossly intact throughout PSYCH:  Cognitively intact, oriented to person place and time   EKG:  EKG is ordered today. 05/19/16: Sinus tachycardia rate 118 bpm. 01/08/17: Sinus tachycardia. Rate 10 8/m.  04/13/17: Sinus rhythm.  Rate 86 bpm.  11/06/19: Sinus tachycardia.  Rate 108 bpm.  Recent Labs: No results found for requested labs within last 8760 hours.   11/16/16: WBC 7.4 Hemoglobin 10.5, hematocrit 32.6, platelets 386   Lipid Panel No results found for: CHOL, TRIG, HDL, CHOLHDL, VLDL, LDLCALC, LDLDIRECT    Wt Readings from Last 3 Encounters:  11/06/19 143 lb (64.9 kg)  12/20/18 126 lb 9.6 oz (57.4 kg)  09/19/18 124 lb 9.6 oz (56.5 kg)      ASSESSMENT AND PLAN:  # Inappropriate sinus tachycardia: Heart rates are controlled on ivabradine.  She has been out of it for a week and she is tachycardic.  We will refill today.  Current medicines are reviewed at length with the patient today.  The patient does not have concerns regarding medicines.  The following changes have been made:  No change  Labs/ tests ordered today include:   Orders Placed This Encounter  Procedures  . EKG 12-Lead    Disposition:   FU with Kristine Lundeen C. Oval Linsey, MD, [facility] in 1 year   This note was written with the assistance of speech recognition software.  Please excuse any transcriptional errors.  Signed, Kristine Rader C. Oval Linsey, MD, [facility]  11/06/2019 12:29 PM    [place]

## 2019-11-06 NOTE — Patient Instructions (Signed)
Medication Instructions:  YOUR CORLANOR HAS BEEN SENT TO THE PHARMACY  *If you need a refill on your cardiac medications before your next appointment, please call your pharmacy*  Lab Work: NONE  Testing/Procedures: NONE  Follow-Up: At [facility], you and your health needs are our priority.  As part of our continuing mission to provide you with exceptional heart care, we have created designated Provider Care Teams.  These Care Teams include your primary Cardiologist (physician) and Advanced Practice Providers (APPs -  Physician Assistants and Nurse Practitioners) who all work together to provide you with the care you need, when you need it.  Your next appointment:   Your physician wants you to follow-up in: [place] will receive a reminder letter in the mail two months in advance. If you don't receive a letter, please call our office to schedule the follow-up appointment.  The format for your next appointment:   Either In Person or Virtual  Provider:   You may see DR [facility]  or one of the following Advanced Practice Providers on your designated Care Team:    Kerin Ransom, PA-C  [place], [place]  Coletta Memos, [place]

## 2019-11-22 ENCOUNTER — Other Ambulatory Visit: Payer: Self-pay

## 2019-11-22 MED ORDER — VYVANSE 50 MG PO CAPS: 50.0000 mg | ORAL_CAPSULE | Freq: Every day | ORAL | 0 refills | Status: DC

## 2019-11-22 MED ORDER — VYVANSE 30 MG PO CAPS: 30.0000 mg | ORAL_CAPSULE | Freq: Every day | ORAL | 0 refills | Status: DC

## 2019-11-22 NOTE — Telephone Encounter (Signed)
Mom called in for refill for Vyvanse. Last visit 09/11/2019 next visit 01/05/2020 . Please escribe to [facility]

## 2019-12-02 ENCOUNTER — Other Ambulatory Visit: Payer: Self-pay | Admitting: Family

## 2019-12-02 DIAGNOSIS — F411 Generalized anxiety disorder: Secondary | ICD-10-CM

## 2019-12-04 NOTE — Telephone Encounter (Signed)
RX for above e-scribed and sent to pharmacy on record  [facility] - [place] - [address] [place] [address] Phone: [phone] Fax: [phone]    

## 2019-12-04 NOTE — Telephone Encounter (Signed)
Last visit 09/11/2019 next visit 01/05/2020

## 2020-01-05 ENCOUNTER — Other Ambulatory Visit: Payer: Self-pay

## 2020-01-05 ENCOUNTER — Ambulatory Visit (INDEPENDENT_AMBULATORY_CARE_PROVIDER_SITE_OTHER): Payer: 59 | Admitting: Family

## 2020-01-05 ENCOUNTER — Encounter: Payer: Self-pay | Admitting: Family

## 2020-01-05 DIAGNOSIS — R0602 Shortness of breath: Secondary | ICD-10-CM | POA: Diagnosis not present

## 2020-01-05 DIAGNOSIS — F419 Anxiety disorder, unspecified: Secondary | ICD-10-CM | POA: Diagnosis not present

## 2020-01-05 DIAGNOSIS — F902 Attention-deficit hyperactivity disorder, combined type: Secondary | ICD-10-CM | POA: Diagnosis not present

## 2020-01-05 DIAGNOSIS — Z79899 Other long term (current) drug therapy: Secondary | ICD-10-CM

## 2020-01-05 DIAGNOSIS — R000 Tachycardia, unspecified: Secondary | ICD-10-CM | POA: Diagnosis not present

## 2020-01-05 DIAGNOSIS — R278 Other lack of coordination: Secondary | ICD-10-CM

## 2020-01-05 DIAGNOSIS — Z719 Counseling, unspecified: Secondary | ICD-10-CM

## 2020-01-05 MED ORDER — VYVANSE 50 MG PO CAPS: 50.0000 mg | ORAL_CAPSULE | Freq: Every day | ORAL | 0 refills | Status: DC

## 2020-01-05 MED ORDER — VYVANSE 30 MG PO CAPS: 30.0000 mg | ORAL_CAPSULE | Freq: Every day | ORAL | 0 refills | Status: DC

## 2020-01-05 NOTE — Progress Notes (Signed)
[facility] [address] [place] [address] Dept: [phone] Dept Fax: [phone]  Medication Check visit via Virtual Video due to COVID-19  Patient ID:  Kristine Edwards  female DOB: [birth year]   24 y.o.   MRN: [id number]   DATE:01/05/20  PCP: Cari Caraway, MD  Virtual Visit via Video Note  I connected with  Shenetta Schnackenberg on 01/05/20 at  9:00 AM EST by a video enabled telemedicine application and verified that I am speaking with the correct person using two identifiers. Patient/Parent Location: at home   I discussed the limitations, risks, security and privacy concerns of performing an evaluation and management service by telephone and the availability of in person appointments. I also discussed with the parents that there may be a patient responsible charge related to this service. The parents expressed understanding and agreed to proceed.  Provider: Carolann Littler, NP  Location: private location  HISTORY/CURRENT STATUS: Kristine Edwards is here for medication management of the psychoactive medications for ADHD and review of educational and behavioral concerns.   Holy currently taking Vyvanse and Zoloft, which is working well. Takes medication daily at the same time. Medication tends to wear off around evening time for the Vyvanse. Jonai is able to focus through school/homework.   Veronnica is eating well (eating breakfast, lunch and dinner). Eating with no issues.   Sleeping well (goes to bed at 3:00 am wakes at 8:00 am), sleeping through the night. Occasional napping during the day due to schedule being off.   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 [name]   Activities/ Exercise: rarely  Screen time: (phone, tablet, TV, computer): computer for learning, phone, TV, games, and movies.  MEDICAL HISTORY: Individual Medical History/ Review of Systems: Changes? :Yes Cariology recently for medication management. Dental f/u recently.   Family Medical/ Social History: Changes? None reported Patient Lives with: parents  Current Medications:  Current Outpatient Medications  Medication Instructions  . ivabradine (CORLANOR) 7.5 MG TABS tablet TAKE 1 TABLET(7.5 MG) BY MOUTH TWICE DAILY  . sertraline (ZOLOFT) 100 MG tablet TAKE 1 TABLET ONCE DAILY.  [name] Vyvanse 50 mg, Oral, Daily  . Vyvanse 30 mg, Oral, Daily   Medication Side Effects: None  MENTAL HEALTH: Mental Health Issues:   Anxiety-well controlled with Zoloft with no suicidal thoughts or ideations.   DIAGNOSES:    ICD-10-CM   1. ADHD (attention deficit hyperactivity disorder), combined type  F90.2   2. Anxiety  F41.9   3. Inappropriate sinus tachycardia  R00.0   4. Shortness of breath  R06.02   5. Dysgraphia  R27.8   6. Medication management  Z79.899   7. Patient counseled  Z71.9     RECOMMENDATIONS:  Discussed recent history with patient with updates for school, [place] graduation, online learning, health and medication  Discussed school academic progress and recommended continued accommodations as needed with online learning.   Discussed developmental phase and current weight with increased since last visit with patient.  Recommended healthy food choices, watching portion sizes, avoiding second helpings, avoiding sugary drinks like soda and tea, drinking more water, getting more exercise.   Discussed  continued need for structure, routine, reward (external), motivation (internal), positive reinforcement, consequences, and organization with school work online at  home.   Encouraged recommended limitations on TV, tablets, phones, video games and computers for non-educational activities.   Discussed need for bedtime routine, use of good sleep hygiene, no video games, TV or phones for an hour before bedtime.   Encouraged physical activity and outdoor play, maintaining social distancing.   Counseled medication pharmacokinetics, options, dosage, administration, desired effects, and possible side effects.   Zoloft 100 mg daily, no Rx today Vyvanse 50 mg daily, # 30 with no RF's Vyvanse 30 mg daily, # 30 with no RF's RX for above e-scribed and sent to pharmacy on record  [facility] - [place], [place] - [place] [address] [place] [address] Phone: [phone] Fax: [phone]  I discussed the assessment and treatment plan with the patient. The patient was provided an opportunity to ask questions and all were answered. The patient agreed with the plan and demonstrated an understanding of the instructions.   I provided 30 minutes of non-face-to-face time during this encounter. Completed record review for 10 minutes prior to the virtual video visit.   NEXT APPOINTMENT:  Return in about 3 months (around 04/04/2020) for follow up visit.  The patient was advised to call back or seek an in-person evaluation if the symptoms worsen or if the condition fails to improve as anticipated.  Medical Decision-making: More than 50% of the appointment was spent counseling and discussing diagnosis and management of symptoms with the patient and family.  Carron Curie, NP

## 2020-02-12 ENCOUNTER — Other Ambulatory Visit: Payer: Self-pay

## 2020-02-12 MED ORDER — VYVANSE 30 MG PO CAPS: 30.0000 mg | ORAL_CAPSULE | Freq: Every day | ORAL | 0 refills | Status: DC

## 2020-02-12 MED ORDER — VYVANSE 50 MG PO CAPS: 50.0000 mg | ORAL_CAPSULE | Freq: Every day | ORAL | 0 refills | Status: DC

## 2020-02-12 NOTE — Telephone Encounter (Signed)
Mom called in for refill for Vyvanse. Last visit 01/05/2020 next visit 04/02/2020. Please escribe to [facility]

## 2020-02-12 NOTE — Telephone Encounter (Signed)
E-Prescribed Vyvanse 50 and 30 directly to  [facility] - [place], [place] - [place] [address] [place] [address] Phone: [phone] Fax: [phone]

## 2020-03-08 ENCOUNTER — Other Ambulatory Visit: Payer: Self-pay | Admitting: Pediatrics

## 2020-03-08 DIAGNOSIS — F411 Generalized anxiety disorder: Secondary | ICD-10-CM

## 2020-03-11 NOTE — Telephone Encounter (Signed)
Zoloft 100 mg daily, # 30 with no RF's.Malena Peer for above e-scribed and sent to pharmacy on record  [facility] - [place], [place] [address] [place] [address] Phone: [phone] Fax: [phone]

## 2020-03-15 ENCOUNTER — Telehealth: Payer: Self-pay | Admitting: Family

## 2020-03-15 MED ORDER — VYVANSE 30 MG PO CAPS: 30.0000 mg | ORAL_CAPSULE | Freq: Every day | ORAL | 0 refills | Status: DC

## 2020-03-15 MED ORDER — VYVANSE 50 MG PO CAPS: 50.0000 mg | ORAL_CAPSULE | Freq: Every day | ORAL | 0 refills | Status: DC

## 2020-03-15 NOTE — Telephone Encounter (Signed)
Vyvanse 50 mg daily and Vyvanse 30 mg daily each Rx # 30 with no RF's.RX for above e-scribed and sent to pharmacy on record  [facility] - [place], [place] - [place] [address] [place] [address] Phone: [phone] Fax: [phone]

## 2020-03-15 NOTE — Telephone Encounter (Signed)
Mom called for refills for Vyvanse 50 mg and Vyvanse 30 mg.  Patient last seen 01/05/20, next appointment 04/02/20.

## 2020-04-02 ENCOUNTER — Other Ambulatory Visit: Payer: Self-pay

## 2020-04-02 ENCOUNTER — Encounter: Payer: Self-pay | Admitting: Family

## 2020-04-02 ENCOUNTER — Telehealth (INDEPENDENT_AMBULATORY_CARE_PROVIDER_SITE_OTHER): Payer: 59 | Admitting: Family

## 2020-04-02 DIAGNOSIS — F419 Anxiety disorder, unspecified: Secondary | ICD-10-CM | POA: Diagnosis not present

## 2020-04-02 DIAGNOSIS — Z7189 Other specified counseling: Secondary | ICD-10-CM

## 2020-04-02 DIAGNOSIS — F902 Attention-deficit hyperactivity disorder, combined type: Secondary | ICD-10-CM

## 2020-04-02 DIAGNOSIS — I4711 Inappropriate sinus tachycardia, so stated: Secondary | ICD-10-CM

## 2020-04-02 DIAGNOSIS — R000 Tachycardia, unspecified: Secondary | ICD-10-CM | POA: Diagnosis not present

## 2020-04-02 DIAGNOSIS — R0602 Shortness of breath: Secondary | ICD-10-CM

## 2020-04-02 DIAGNOSIS — F819 Developmental disorder of scholastic skills, unspecified: Secondary | ICD-10-CM

## 2020-04-02 DIAGNOSIS — Z719 Counseling, unspecified: Secondary | ICD-10-CM

## 2020-04-02 DIAGNOSIS — F411 Generalized anxiety disorder: Secondary | ICD-10-CM

## 2020-04-02 DIAGNOSIS — R278 Other lack of coordination: Secondary | ICD-10-CM | POA: Diagnosis not present

## 2020-04-02 DIAGNOSIS — Z79899 Other long term (current) drug therapy: Secondary | ICD-10-CM

## 2020-04-02 MED ORDER — SERTRALINE HCL 100 MG PO TABS: 100.0000 mg | ORAL_TABLET | Freq: Every day | ORAL | 2 refills | Status: DC

## 2020-04-02 NOTE — Progress Notes (Signed)
[facility] [address] [place] [address] Dept: [phone] Dept Fax: [phone]  Medication Check visit via Virtual Video due to COVID-19  Patient ID:  Kristine Edwards  female DOB: Jan 29, 1996   23 y.o.   MRN: [id number]   DATE:04/02/20  PCP: Gweneth Dimitri, MD  Virtual Visit via Video Note  I connected with  Marcelle Hepner on 04/02/20 at 11:00 AM EDT by a video enabled telemedicine application and verified that I am speaking with the correct person using two identifiers. Patient/Parent Location: at home   I discussed the limitations, risks, security and privacy concerns of performing an evaluation and management service by telephone and the availability of in person appointments. I also discussed with the parents that there may be a patient responsible charge related to this service. The parents expressed understanding and agreed to proceed.  Provider: Carron Curie, NP  Location: private location  HISTORY/CURRENT STATUS: Rani Tayja Manzer is here for medication management of the psychoactive medications for ADHD and review of educational and behavioral concerns.   Latoy currently taking Zoloft and Vyvanse, which is working well. Takes medication at noon time daily. Medication tends to wear off around evening time for the Vyvanse. Georgie is able to focus through school/homework.   Emanii is eating well (eating breakfast, lunch and dinner). Eating with no concerns or issues.   Sleeping well (getting enough sleep but not on a routine schedule), sleeping through the night.   EDUCATION: School: [employer], [employer], 2 classes this semester for 5 weeks.   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   Activities/ Exercise: rarely  Screen time: (phone, tablet, TV, computer): computer for learning, TV, phone, movies, and games.   MEDICAL HISTORY: Individual Medical History/ Review of Systems: Changes? :Yes 2nd COVID-19 vaccine yesterday with HA, chills, arm soreness reported today. PCP for well check in the next month.   Family Medical/ Social History: Changes? None, parents have been vaccinated. Patient Lives with: parents  Current Medications:  Current Outpatient Medications  Medication Instructions  . ivabradine (CORLANOR) 7.5 MG TABS tablet TAKE 1 TABLET(7.5 MG) BY MOUTH TWICE DAILY  . sertraline (ZOLOFT) 100 mg, Oral, Daily  . Vyvanse 30 mg, Oral, Daily  . Vyvanse 50 mg, Oral, Daily   Medication Side Effects: None  MENTAL HEALTH: Mental Health Issues:   Anxiety-Zoloft to control symptoms  DIAGNOSES:    ICD-10-CM   1. ADHD (attention deficit hyperactivity disorder), combined type  F90.2   2. Anxiety  F41.9   3. Inappropriate sinus tachycardia  R00.0   4. Dysgraphia  R27.8   5. Problems with learning  F81.9   6. Patient counseled  Z71.9   7. Medication management  Z79.899   8. Goals of care, counseling/discussion  Z71.89   9. Shortness of breath  R06.02   10. Generalized anxiety disorder  F41.1 sertraline (ZOLOFT) 100 MG tablet    RECOMMENDATIONS:  Discussed recent history with patient with updates for school, learning, academics, health and medication.   Discussed school academic progress and recommended continued accommodations needed for learning support.   Discussed growth and development and current weight. Recommended healthy food choices, watching portion sizes, avoiding second helpings, avoiding sugary drinks like soda and tea, drinking  more water, getting more exercise.   Discussed continued need for structure, routine, reward (external), motivation (internal), positive reinforcement, consequences, and  organization with online learning at home in the same environment at home.  Encouraged recommended limitations on TV, tablets, phones, video games and computers for non-educational activities.   Discussed need for bedtime routine, use of good sleep hygiene, no video games, TV or phones for an hour before bedtime.   Encouraged physical activity and outdoor play, maintaining social distancing.   Counseled medication pharmacokinetics, options, dosage, administration, desired effects, and possible side effects.   Zoloft 100 mg daily, # 30 with 2 RF's Vyvanse 30 mg no Rx Vyvanse 50 mg daily, no Rx RX for above e-scribed and sent to pharmacy on record  [place], [place] [address] Phone: [phone] Fax: [phone]  I discussed the assessment and treatment plan with the patient. The patient was provided an opportunity to ask questions and all were answered. The patient agreed with the plan and demonstrated an understanding of the instructions.   I provided 30 minutes of non-face-to-face time during this encounter. Completed record review for 10 minutes prior to the virtual video visit.   NEXT APPOINTMENT:  Return in about 3 months (around 07/02/2020) for follow up visit.  The patient was advised to call back or seek an in-person evaluation if the symptoms worsen or if the condition fails to improve as anticipated.  Medical Decision-making: More than 50% of the appointment was spent counseling and discussing diagnosis and management of symptoms with the patient and family.  Carolann Littler, NP

## 2020-05-10 ENCOUNTER — Other Ambulatory Visit: Payer: Self-pay

## 2020-05-10 MED ORDER — VYVANSE 50 MG PO CAPS: 50.0000 mg | ORAL_CAPSULE | Freq: Every day | ORAL | 0 refills | Status: DC

## 2020-05-10 MED ORDER — VYVANSE 30 MG PO CAPS: 30.0000 mg | ORAL_CAPSULE | Freq: Every day | ORAL | 0 refills | Status: DC

## 2020-05-10 NOTE — Telephone Encounter (Signed)
Mom called in for refill for Vyvanse. Last visit 04/02/2020 next visit 06/20/2020. Please escribe to [facility] 

## 2020-05-10 NOTE — Telephone Encounter (Signed)
E-Prescribed Vyvanse 30 and Vyvanse 50 directly to  [facility] - [place], [place] - [place] [address] [place] [address] Phone: [phone] Fax: [phone]

## 2020-06-14 ENCOUNTER — Other Ambulatory Visit: Payer: Self-pay

## 2020-06-14 MED ORDER — VYVANSE 50 MG PO CAPS: 50.0000 mg | ORAL_CAPSULE | Freq: Every day | ORAL | 0 refills | Status: DC

## 2020-06-14 MED ORDER — VYVANSE 30 MG PO CAPS: 30.0000 mg | ORAL_CAPSULE | Freq: Every day | ORAL | 0 refills | Status: DC

## 2020-06-14 NOTE — Telephone Encounter (Signed)
Mom called in for refill for Vyvanse. Last visit 04/02/2020 next visit 06/20/2020. Please escribe to [facility]

## 2020-06-14 NOTE — Telephone Encounter (Signed)
RX for above e-scribed and sent to pharmacy on record  [facility] - [place], [place] - [address] [place] [address] Phone: [phone] Fax: [phone]    

## 2020-06-20 ENCOUNTER — Encounter: Payer: Self-pay | Admitting: Family

## 2020-06-20 ENCOUNTER — Other Ambulatory Visit: Payer: Self-pay

## 2020-06-20 ENCOUNTER — Ambulatory Visit (INDEPENDENT_AMBULATORY_CARE_PROVIDER_SITE_OTHER): Payer: 59 | Admitting: Family

## 2020-06-20 VITALS — BP 102/64 | HR 76 | Resp 16 | Ht 61.0 in | Wt 142.6 lb

## 2020-06-20 DIAGNOSIS — Z87898 Personal history of other specified conditions: Secondary | ICD-10-CM

## 2020-06-20 DIAGNOSIS — Z79899 Other long term (current) drug therapy: Secondary | ICD-10-CM

## 2020-06-20 DIAGNOSIS — F902 Attention-deficit hyperactivity disorder, combined type: Secondary | ICD-10-CM | POA: Diagnosis not present

## 2020-06-20 DIAGNOSIS — R278 Other lack of coordination: Secondary | ICD-10-CM

## 2020-06-20 DIAGNOSIS — R000 Tachycardia, unspecified: Secondary | ICD-10-CM | POA: Diagnosis not present

## 2020-06-20 DIAGNOSIS — Z7189 Other specified counseling: Secondary | ICD-10-CM

## 2020-06-20 DIAGNOSIS — F419 Anxiety disorder, unspecified: Secondary | ICD-10-CM

## 2020-06-20 DIAGNOSIS — I4711 Inappropriate sinus tachycardia, so stated: Secondary | ICD-10-CM

## 2020-06-20 DIAGNOSIS — Z719 Counseling, unspecified: Secondary | ICD-10-CM

## 2020-06-20 DIAGNOSIS — F411 Generalized anxiety disorder: Secondary | ICD-10-CM

## 2020-06-20 MED ORDER — SERTRALINE HCL 100 MG PO TABS: 100.0000 mg | ORAL_TABLET | Freq: Every day | ORAL | 2 refills | Status: DC

## 2020-06-20 NOTE — Progress Notes (Signed)
[facility] [address] [place] [address] Dept: [phone] Dept Fax: [phone] Loc: [phone] Loc Fax: [phone]  Medication Check  Patient ID: Kristine Edwards, female  DOB: 05-06-1996, 24 y.o.  MRN: [id number]  Date of Evaluation: 06/20/2020 PCP: Gweneth Dimitri, MD  Accompanied by: self Patient Lives with: parents  HISTORY/CURRENT STATUS: HPI Patient here by herself for the visit today. Patient interactive and appropriate with provider today. Patient has continued with online program through [facility] and taking 2 classes each semester. Did well the past few semesters and will complete the program next year. Patient started working out regularly a few times weekly with weights and cardio exercises each session. Patient has continued with medication management with no side effects.   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 Performance/ Grades: average Services: Other: tutoring and help as needed online Activities/ Exercise: intermittently-going to the gym over the past month regularly.   MEDICAL HISTORY: Appetite:Good with some changes that she is trying to make with her diet. Trying to add more fruits and vegetables.  MVI/Other: Probiotics on occasions  Sleep: Bedtime: 3:00 am  Awakens: 1:00 pm  Concerns: Initiation/Maintenance/Other: sleep schedule is off and doing homework in the evening time.   Individual Medical History/ Review of Systems: Changes? :None recently.   Allergies: Patient has no known allergies.  Current Medications:  Current Outpatient Medications:  .  ivabradine (CORLANOR) 7.5 MG TABS tablet, TAKE 1 TABLET(7.5 MG) BY MOUTH TWICE DAILY, Disp: 180 tablet, Rfl: 3 .   sertraline (ZOLOFT) 100 MG tablet, Take 1 tablet (100 mg total) by mouth daily., Disp: 30 tablet, Rfl: 2 .  VYVANSE 30 MG capsule, Take 1 capsule (30 mg total) by mouth daily., Disp: 30 capsule, Rfl: 0 .  VYVANSE 50 MG capsule, Take 1 capsule (50 mg total) by mouth daily., Disp: 30 capsule, Rfl: 0 Medication Side Effects: None  Family Medical/ Social History: Changes? None   MENTAL HEALTH: Mental Health Issues: Anxiety-Zoloft daily with good symptom control  PHYSICAL EXAM; Vitals:  Vitals:   06/20/20 1501  BP: 102/64  Pulse: 76  Resp: 16  Height: 5\' 1"  (1.549 m)  Weight: 142 lb 9.6 oz (64.7 kg)  BMI (Calculated): 26.96   General Physical Exam: Unchanged from previous exam, date:None  Changed: Decreased weight some  DIAGNOSES:    ICD-10-CM   1. ADHD (attention deficit hyperactivity disorder), combined type  F90.2   2. Anxiety  F41.9   3. Inappropriate sinus tachycardia  R00.0   4. Dysgraphia  R27.8   5. Medication management  Z79.899   6. Patient counseled  Z71.9   7. Goals of care, counseling/discussion  Z71.89   8. History of shortness of breath  Z87.898   9. Generalized anxiety disorder  F41.1 sertraline (ZOLOFT) 100 MG tablet   RECOMMENDATIONS: Counseling at this visit included the review of old records and/or current chart with the patient with updates for school, academics, health and medications.   Discussed recent history and today's examination with patient with no changes on exam today.   Counseled regarding health and updates since last visit in the office.    Recommended a high protein, low sugar diet, watch portion sizes, avoid second helpings, avoid sugary snacks and drinks, drink more water, eat more fruits and vegetables, increase  daily exercise.  Discussed school academic and behavioral progress and advocated for appropriate accommodations as needed for learning support.   Discussed importance of maintaining structure, routine, organization, reward,  motivation and consequences with consistency with school, home and social situations.   Counseled medication pharmacokinetics, options, dosage, administration, desired effects, and possible side effects.   Zoloft 100 mg daily, # 30 with 2 RF's Vyvanse 50 mg daily, no Rx today Vyvanse 30 mg daily, no Rx today RX for above e-scribed and sent to pharmacy on record  [facility] - [place], [place] - [place] [address] [place] [address] Phone: [phone] Fax: [phone]  Advised importance of:  Good sleep hygiene (8- 10 hours per night, no TV or video games for 1 hour before bedtime) Limited screen time (none on school nights, no more than 2 hours/day on weekends, use of screen time for motivation) Regular exercise(outside and active play) Healthy eating (drink water or milk, no sodas/sweet tea, limit portions and no seconds).   NEXT APPOINTMENT: Return in about 3 months (around 09/20/2020) for f/u visit. Medical Decision-making: More than 50% of the appointment was spent counseling and discussing diagnosis and management of symptoms with the patient and family.  Carron Curie, NP Counseling Time: 40 mins  Total Contact Time: 45 mins

## 2020-08-05 ENCOUNTER — Other Ambulatory Visit: Payer: Self-pay | Admitting: Family

## 2020-08-05 MED ORDER — VYVANSE 50 MG PO CAPS: 50.0000 mg | ORAL_CAPSULE | Freq: Every day | ORAL | 0 refills | Status: DC

## 2020-08-05 MED ORDER — VYVANSE 30 MG PO CAPS: 30.0000 mg | ORAL_CAPSULE | Freq: Every day | ORAL | 0 refills | Status: DC

## 2020-08-05 NOTE — Telephone Encounter (Signed)
E-Prescribed Vyvanse 50 and Vyvanse 30 directly to  [facility] - [place], [place] - [place] [address] [place] [address] Phone: [phone] Fax: [phone]

## 2020-08-05 NOTE — Telephone Encounter (Signed)
Mom called for refills for Vyvanse 30 mg and Vyvanse 50 mg.  Patient last seen 06/20/20. Please e-scribe to [facility].

## 2020-09-05 ENCOUNTER — Other Ambulatory Visit: Payer: Self-pay

## 2020-09-05 DIAGNOSIS — F411 Generalized anxiety disorder: Secondary | ICD-10-CM

## 2020-09-05 MED ORDER — VYVANSE 30 MG PO CAPS: 30.0000 mg | ORAL_CAPSULE | Freq: Every day | ORAL | 0 refills | Status: DC

## 2020-09-05 MED ORDER — VYVANSE 50 MG PO CAPS: 50.0000 mg | ORAL_CAPSULE | Freq: Every day | ORAL | 0 refills | Status: DC

## 2020-09-05 MED ORDER — SERTRALINE HCL 100 MG PO TABS: 100.0000 mg | ORAL_TABLET | Freq: Every day | ORAL | 2 refills | Status: DC

## 2020-09-05 NOTE — Telephone Encounter (Signed)
RX for above e-scribed and sent to pharmacy on record  [facility] - [place], [place] - [address] [place] [address] Phone: [phone] Fax: [phone]    

## 2020-09-05 NOTE — Telephone Encounter (Signed)
Mom called for refills for Vyvanse and Zoloft.  Last visit 06/20/20 next visit 10/14/2020. Please e-scribe to [facility]

## 2020-10-02 ENCOUNTER — Other Ambulatory Visit: Payer: Self-pay

## 2020-10-02 MED ORDER — VYVANSE 30 MG PO CAPS: 30.0000 mg | ORAL_CAPSULE | Freq: Every day | ORAL | 0 refills | Status: DC

## 2020-10-02 MED ORDER — VYVANSE 50 MG PO CAPS: 50.0000 mg | ORAL_CAPSULE | Freq: Every day | ORAL | 0 refills | Status: DC

## 2020-10-02 NOTE — Telephone Encounter (Signed)
Mom called for refills for Vyvanse. Last visit 06/20/20 next visit 10/14/2020. Please e-scribe to [facility]

## 2020-10-02 NOTE — Telephone Encounter (Signed)
Vyvanse 50 mg and 30 mg # 30 each Rx with no RF's.RX for above e-scribed and sent to pharmacy on record  [facility] - [place], [place] - [place] [address] [place] [address] Phone: [phone] Fax: [phone]

## 2020-10-04 ENCOUNTER — Telehealth: Payer: Self-pay

## 2020-10-04 NOTE — Telephone Encounter (Signed)
Pharm faxed in Prior Auth for Vyvanse. Last visit 06/20/2020 next visit 10/14/2020. Submitting Prior Auth to [employer]

## 2020-10-04 NOTE — Telephone Encounter (Signed)
Vyvanse 50mg : Outcome Additional Information Required This medication or product was previously approved on from 2020-10-04 to 2021-10-04. **Please note: Formulary lowering, tiering exception, cost reduction and/or pre-benefit determination review (including prospective Medicare hospice reviews) requests cannot be requested using this method of submission. Please contact 2021-10-06 at [phone] instead.  Vyvanse 30mg : Outcome Approvedtoday Request Reference Number: [id number]. VYVANSE CAP 30MG  is approved through 10/04/2021. Your patient may now fill this prescription and it will be covered.

## 2020-10-14 ENCOUNTER — Encounter: Payer: Self-pay | Admitting: Family

## 2020-10-14 ENCOUNTER — Telehealth (INDEPENDENT_AMBULATORY_CARE_PROVIDER_SITE_OTHER): Payer: 59 | Admitting: Family

## 2020-10-14 ENCOUNTER — Other Ambulatory Visit: Payer: Self-pay

## 2020-10-14 DIAGNOSIS — Z79899 Other long term (current) drug therapy: Secondary | ICD-10-CM

## 2020-10-14 DIAGNOSIS — F902 Attention-deficit hyperactivity disorder, combined type: Secondary | ICD-10-CM

## 2020-10-14 DIAGNOSIS — Z7189 Other specified counseling: Secondary | ICD-10-CM

## 2020-10-14 DIAGNOSIS — F411 Generalized anxiety disorder: Secondary | ICD-10-CM

## 2020-10-14 DIAGNOSIS — R000 Tachycardia, unspecified: Secondary | ICD-10-CM

## 2020-10-14 DIAGNOSIS — Z719 Counseling, unspecified: Secondary | ICD-10-CM

## 2020-10-14 DIAGNOSIS — F419 Anxiety disorder, unspecified: Secondary | ICD-10-CM | POA: Diagnosis not present

## 2020-10-14 DIAGNOSIS — F819 Developmental disorder of scholastic skills, unspecified: Secondary | ICD-10-CM

## 2020-10-14 DIAGNOSIS — R278 Other lack of coordination: Secondary | ICD-10-CM

## 2020-10-14 NOTE — Progress Notes (Signed)
[facility] [address] [place] [place] [address] Dept: [phone] Dept Fax: [phone]  Medication Check visit via Virtual Video due to COVID-19  Patient ID:  Kristine Edwards  female DOB: [birth year]   24 y.o.   MRN: [id number]   DATE:10/14/20  PCP: Gweneth Dimitri, MD  Virtual Visit via Video Note  I connected with  Kristine Edwards on 10/14/20 at 11:00 AM EST by a video enabled telemedicine application and verified that I am speaking with the correct person using two identifiers. Patient/Parent Location: at home   I discussed the limitations, risks, security and privacy concerns of performing an evaluation and management service by telephone and the availability of in person appointments. I also discussed with the parents that there may be a patient responsible charge related to this service. The parents expressed understanding and agreed to proceed.  Provider: Carron Curie, NP  Location: Private work locaiton  HISTORY/CURRENT STATUS: Kristine Edwards is here for medication management of the psychoactive medications for ADHD and review of educational and behavioral concerns.   Kristine Edwards currently taking Zoloft and Vyvanse, which is working well. Takes medication as directed daily. Medication tends to wear off around in the evening. Kristine Edwards is able to focus through homework.   Kristine Edwards is eating well (eating breakfast, lunch and dinner). Eating ok, some ups and down.  Sleeping well (getting enough sleep), sleeping through the night. Staying up late and sleeping late with some naps.   EDUCATION: School: [employer] online Just finished her semester yesterday and will start the next semester on Monday Year/Grade: college transfer program  Performance/ Grades:  Services: IEP/504 Plan and Other: Disability Services, as needed  Activities/ Exercise: intermittently-walking the dogs   Screen  time: (phone, tablet, TV, computer): computer for school work, phone, TV and games.   MEDICAL HISTORY: Individual Medical History/ Review of Systems: Changes? :None reported recently. To schedule PCP and cardiology follow up  Appt.   Family Medical/ Social History: Changes? None reported recently Patient Lives with: parents  Current Medications:  Medication Side Effects: None  MENTAL HEALTH: Mental Health Issues:   Anxiety-Zoloft 100 mg 1 tablet daily with good symptom control now.   Not driving now, but will start to drive soon.   DIAGNOSES:    ICD-10-CM   1. ADHD (attention deficit hyperactivity disorder), combined type  F90.2   2. Anxiety  F41.9   3. Inappropriate sinus tachycardia  R00.0   4. Generalized anxiety disorder  F41.1   5. Problems with learning  F81.9   6. Dysgraphia  R27.8   7. Medication management  Z79.899   8. Patient counseled  Z71.9   9. Goals of care, counseling/discussion  Z71.89    RECOMMENDATIONS:  Discussed recent history with patient with updates with school, learning, driving, health and medication.   Discussed school academic progress and recommended continued accommodations with learning support as needed.   Discussed growth and development and current weight. Recommended healthy food choices, watching portion sizes, avoiding second helpings, avoiding sugary drinks like soda and tea, drinking more water, getting more exercise.   Discussed continued need for structure, routine, reward (external), motivation (internal), positive reinforcement, consequences, and organization with home and schooling online.   Encouraged recommended limitations on TV, tablets, phones, video games and computers for non-educational activities.   Discussed need for bedtime routine, use of good sleep hygiene, no video games, TV or phones for an hour before bedtime.  Encouraged physical activity and outdoor play, maintaining social distancing.   Counseled medication  pharmacokinetics, options, dosage, administration, desired effects, and possible side effects.   Zoloft 100 mg daily, no RX today Vyvanse 50 mg daily, no Rx today Vyvanse 30 mg daily, no Rx today  I discussed the assessment and treatment plan with the patient. The patient was provided an opportunity to ask questions and all were answered. The patient agreed with the plan and demonstrated an understanding of the instructions.   I provided 25 minutes of non-face-to-face time during this encounter.   Completed record review for 10 minutes prior to the virtual video visit.   NEXT APPOINTMENT:  Return in about 3 months (around 01/14/2021) for f/u visit.  The patient was advised to call back or seek an in-person evaluation if the symptoms worsen or if the condition fails to improve as anticipated.  Medical Decision-making: More than 50% of the appointment was spent counseling and discussing diagnosis and management of symptoms with the patient and family.  Carron Curie, NP

## 2020-10-30 ENCOUNTER — Other Ambulatory Visit: Payer: Self-pay

## 2020-10-30 MED ORDER — VYVANSE 50 MG PO CAPS
50.0000 mg | ORAL_CAPSULE | Freq: Every day | ORAL | 0 refills | Status: DC
Start: 2020-10-30 — End: 2020-12-02

## 2020-10-30 MED ORDER — VYVANSE 30 MG PO CAPS: 30.0000 mg | ORAL_CAPSULE | Freq: Every day | ORAL | 0 refills | Status: DC

## 2020-10-30 NOTE — Telephone Encounter (Signed)
Vyvanse 50 mg and 30 mg daily, # 30 with no RF's each Rx.Malena Peer for above e-scribed and sent to pharmacy on record  [facility] - [place], [place] - [place] [address] [place] [address] Phone: [phone] Fax: [phone]

## 2020-10-30 NOTE — Telephone Encounter (Signed)
Mom called for refills for Vyvanse.Last visit11/8/21 next visit 01/13/2021. Please e-scribe to [facility]

## 2020-12-02 ENCOUNTER — Other Ambulatory Visit: Payer: Self-pay

## 2020-12-02 DIAGNOSIS — F411 Generalized anxiety disorder: Secondary | ICD-10-CM

## 2020-12-02 NOTE — Telephone Encounter (Signed)
Last visit 10/14/2020 next visit 01/13/2021 

## 2020-12-03 MED ORDER — VYVANSE 30 MG PO CAPS
30.0000 mg | ORAL_CAPSULE | Freq: Every day | ORAL | 0 refills | Status: DC
Start: 2020-12-03 — End: 2021-01-07

## 2020-12-03 MED ORDER — VYVANSE 50 MG PO CAPS
50.0000 mg | ORAL_CAPSULE | Freq: Every day | ORAL | 0 refills | Status: DC
Start: 2020-12-03 — End: 2021-01-07

## 2020-12-03 MED ORDER — SERTRALINE HCL 100 MG PO TABS: 100.0000 mg | ORAL_TABLET | Freq: Every day | ORAL | 2 refills | Status: DC

## 2020-12-03 NOTE — Telephone Encounter (Signed)
Vyvanse 50 mg daily and 30 mg daily, # 30 with no RF's and Zoloft 100 mg daily, # 30 with 2 RF's. RX for above e-scribed and sent to pharmacy on record  [facility] - [place], [place] - [place] [address] [place] [address] Phone: [phone] Fax: [phone]

## 2021-01-06 ENCOUNTER — Other Ambulatory Visit: Payer: Self-pay | Admitting: Family

## 2021-01-07 NOTE — Telephone Encounter (Signed)
Last visit 10/14/2020 next visit 01/13/2021

## 2021-01-07 NOTE — Telephone Encounter (Signed)
Vyvanse 50 mg and 30 mg each Rx # 30 with no RF's.RX for above e-scribed and sent to pharmacy on record  [facility] [place], [place] - [address] [place] [address] Phone: [phone] Fax: [phone]

## 2021-01-13 ENCOUNTER — Other Ambulatory Visit: Payer: Self-pay

## 2021-01-13 ENCOUNTER — Encounter: Payer: Self-pay | Admitting: Family

## 2021-01-13 ENCOUNTER — Telehealth (INDEPENDENT_AMBULATORY_CARE_PROVIDER_SITE_OTHER): Payer: 59 | Admitting: Family

## 2021-01-13 DIAGNOSIS — R278 Other lack of coordination: Secondary | ICD-10-CM

## 2021-01-13 DIAGNOSIS — F419 Anxiety disorder, unspecified: Secondary | ICD-10-CM | POA: Diagnosis not present

## 2021-01-13 DIAGNOSIS — R000 Tachycardia, unspecified: Secondary | ICD-10-CM

## 2021-01-13 DIAGNOSIS — F902 Attention-deficit hyperactivity disorder, combined type: Secondary | ICD-10-CM

## 2021-01-13 DIAGNOSIS — Z7189 Other specified counseling: Secondary | ICD-10-CM

## 2021-01-13 DIAGNOSIS — Z79899 Other long term (current) drug therapy: Secondary | ICD-10-CM

## 2021-01-13 DIAGNOSIS — F819 Developmental disorder of scholastic skills, unspecified: Secondary | ICD-10-CM

## 2021-01-13 DIAGNOSIS — Z719 Counseling, unspecified: Secondary | ICD-10-CM

## 2021-01-13 NOTE — Progress Notes (Signed)
[name] [address] [place] [place] [address] Dept: [phone] Dept Fax: [phone]  Medication Check visit via Virtual Video   Patient ID:  Kristine Edwards  female DOB: 1996/10/23   25 y.o.   MRN: [id number]   DATE:01/13/21  PCP: Gweneth Dimitri, MD  Virtual Visit via Video Note  I connected with  Kristine Edwards on 01/13/21 at  2:00 PM EST by a video enabled telemedicine application and verified that I am speaking with the correct person using two identifiers. Patient/Parent Location: at home   I discussed the limitations, risks, security and privacy concerns of performing an evaluation and management service by telephone and the availability of in person appointments. I also discussed with the parents that there may be a patient responsible charge related to this service. The parents expressed understanding and agreed to proceed.  Provider: Carron Curie, NP  Location: private work location  HPI/CURRENT STATUS: Kristine Edwards is here for medication management of the psychoactive medications for ADHD and review of educational and behavioral concerns.   Kristine Edwards currently taking medication regimen, which is working well. Takes medication as directed daily. Medication tends to wear off around evening time for her Vyvanse. Kristine Edwards is able to focus through school work.   Kristine Edwards is eating well (eating breakfast, lunch and dinner). No changes recently.   Sleeping well (getting enough sleep each night), sleeping through the night, but not on a regular schedule.   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   MEDICAL HISTORY: Individual Medical History/ Review of Systems: None reported recently. Received booster shot for COVID-19.  Family Medical/ Social History: Changes? Mother attempting to stablize her A1C. Patient Lives with: parents  MENTAL HEALTH: Mental Health Issues:   Anxiety-Zoloft for anxiety symptoms. No counseling at this time for her anxiety.   Not driving yet, but studying the [facility] handbook.   Allergies: No Known Allergies  Current Medications:  Current Outpatient Medications on File Prior to Visit  Medication Sig Dispense Refill  . ivabradine (CORLANOR) 7.5 MG TABS tablet TAKE 1 TABLET(7.5 MG) BY MOUTH TWICE DAILY 180 tablet 3  . sertraline (ZOLOFT) 100 MG tablet Take 1 tablet (100 mg total) by mouth daily. 30 tablet 2  . VYVANSE 30 MG capsule TAKE (1) CAPSULE DAILY. 30 capsule 0  . VYVANSE 50 MG capsule TAKE (1) CAPSULE DAILY. 30 capsule 0   No current facility-administered medications on file prior to visit.   Medication Side Effects: None  DIAGNOSES:    ICD-10-CM   1. ADHD (attention deficit hyperactivity disorder), combined type  F90.2   2. Anxiety  F41.9   3. Inappropriate sinus tachycardia  R00.0   4. Dysgraphia  R27.8   5. Learning difficulty  F81.9   6. Medication management  Z79.899   7. Goals of care, counseling/discussion  Z71.89   8. Patient counseled  Z71.9    ASSESSMENT: Patient doing well on current medication regimen. Helping with symptom control and good efficacy for school work completed each day. Disability Services for her online program and help when needed for learning and dysgraphia. No currently receiving counseling services for her anxiety symptoms due to control by her medications. 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 Applied for internship in December with no updates as of yet. Also been looking for work due to graduation in May.   PLAN/RECOMMENDATIONS:  Patient provided  updates for school, classes, and any medical changes.  Disability Services in place at school for online schooling and will graduate in May 2022.   Discussed young adult phase of development and current weight. Recommended healthy food choices, watching portion sizes, avoiding second helpings, avoiding sugary drinks like soda and tea, drinking more water, getting more exercise.   Reviewed structure and daily routine for online schooling from home.   Not currently receiving therapy and denies the need at this time.  Discussed sleep hygiene and routine for better sleep habits.no video games, TV or phones for an hour before bedtime.   Information regarding program and graduation in May 2022.  Reviewed need for support with job searching after graduation. No luck with job for the career she is interested in with [employer]. .  Support provided for job coaching and applications for pending graduation.    Counseled medication pharmacokinetics, options, dosage, administration, desired effects, and possible side effects.   Zoloft 100 mg daily, no Rx today Vyvanse 30 mg daily, no Rx today Vyvanse 50 mg daily, no Rx today  I discussed the assessment and treatment plan with the patient The patient was provided an opportunity to ask questions and all were answered. The patient agreed with the plan and demonstrated an understanding of the instructions.   I provided 35 minutes of non-face-to-face time during this encounter. Completed record review for 10 minutes prior to the virtual video visit.   NEXT APPOINTMENT:  04/08/2021  Return in about 3 months (around 04/12/2021) for f/u visit.  The patient was advised to call back or seek an in-person evaluation if the symptoms worsen or if the condition fails to improve as anticipated.   Carron Curie, NP

## 2021-02-07 ENCOUNTER — Other Ambulatory Visit: Payer: Self-pay

## 2021-02-07 DIAGNOSIS — F411 Generalized anxiety disorder: Secondary | ICD-10-CM

## 2021-02-07 MED ORDER — LISDEXAMFETAMINE DIMESYLATE 30 MG PO CAPS
ORAL_CAPSULE | ORAL | 0 refills | Status: DC
Start: 2021-02-07 — End: 2021-03-17

## 2021-02-07 MED ORDER — LISDEXAMFETAMINE DIMESYLATE 50 MG PO CAPS
ORAL_CAPSULE | ORAL | 0 refills | Status: DC
Start: 2021-02-07 — End: 2021-03-17

## 2021-02-07 MED ORDER — SERTRALINE HCL 100 MG PO TABS: 100.0000 mg | ORAL_TABLET | Freq: Every day | ORAL | 2 refills | Status: DC

## 2021-02-07 NOTE — Telephone Encounter (Signed)
Vyvanse 50 mg and 30 mg # 30 each Rx with no RF's and Zoloft 100 mg # 30 with 2 RF's.RX for above e-scribed and sent to pharmacy on record  [facility] [place], [place] - [address] [facility] [address] [place] [address] Phone: [phone] Fax: [phone]

## 2021-03-17 ENCOUNTER — Other Ambulatory Visit: Payer: Self-pay

## 2021-03-17 MED ORDER — LISDEXAMFETAMINE DIMESYLATE 30 MG PO CAPS
ORAL_CAPSULE | ORAL | 0 refills | Status: DC
Start: 2021-03-17 — End: 2021-04-08

## 2021-03-17 MED ORDER — LISDEXAMFETAMINE DIMESYLATE 50 MG PO CAPS
ORAL_CAPSULE | ORAL | 0 refills | Status: DC
Start: 2021-03-17 — End: 2021-04-08

## 2021-03-17 NOTE — Telephone Encounter (Signed)
E-Prescribed Vyvanse 50 and Vyvanse 30 directly to  [facility] [place], [place] - [address] [place] [address] Phone: [phone] Fax: [phone]

## 2021-03-17 NOTE — Telephone Encounter (Signed)
Last visit 01/13/2021 next visit 04/08/2021

## 2021-04-08 ENCOUNTER — Telehealth (INDEPENDENT_AMBULATORY_CARE_PROVIDER_SITE_OTHER): Payer: 59 | Admitting: Family

## 2021-04-08 ENCOUNTER — Other Ambulatory Visit: Payer: Self-pay

## 2021-04-08 ENCOUNTER — Encounter: Payer: Self-pay | Admitting: Family

## 2021-04-08 DIAGNOSIS — F902 Attention-deficit hyperactivity disorder, combined type: Secondary | ICD-10-CM | POA: Diagnosis not present

## 2021-04-08 DIAGNOSIS — Z7189 Other specified counseling: Secondary | ICD-10-CM

## 2021-04-08 DIAGNOSIS — Z72821 Inadequate sleep hygiene: Secondary | ICD-10-CM

## 2021-04-08 DIAGNOSIS — Z719 Counseling, unspecified: Secondary | ICD-10-CM

## 2021-04-08 DIAGNOSIS — Z79899 Other long term (current) drug therapy: Secondary | ICD-10-CM

## 2021-04-08 DIAGNOSIS — I4711 Inappropriate sinus tachycardia, so stated: Secondary | ICD-10-CM

## 2021-04-08 DIAGNOSIS — R278 Other lack of coordination: Secondary | ICD-10-CM | POA: Diagnosis not present

## 2021-04-08 DIAGNOSIS — F419 Anxiety disorder, unspecified: Secondary | ICD-10-CM | POA: Diagnosis not present

## 2021-04-08 DIAGNOSIS — R000 Tachycardia, unspecified: Secondary | ICD-10-CM | POA: Diagnosis not present

## 2021-04-08 DIAGNOSIS — F819 Developmental disorder of scholastic skills, unspecified: Secondary | ICD-10-CM

## 2021-04-08 MED ORDER — LISDEXAMFETAMINE DIMESYLATE 50 MG PO CAPS: ORAL_CAPSULE | ORAL | 0 refills | Status: DC

## 2021-04-08 MED ORDER — LISDEXAMFETAMINE DIMESYLATE 30 MG PO CAPS: ORAL_CAPSULE | ORAL | 0 refills | Status: DC

## 2021-04-08 NOTE — Progress Notes (Signed)
[facility] [address], [place]. [address] [place] [address] Dept: [phone] Dept Fax: [phone]  Medication Check visit via Virtual Video   Patient ID:  Kristine Edwards  female DOB: Aug 07, 1996   25 y.o.   MRN: [id number]   DATE:04/08/21  PCP: Gweneth Dimitri, MD  Virtual Visit via Video Note  I connected with  Kristine Edwards on 04/08/21 at  3:00 PM EDT by a video enabled telemedicine application and verified that I am speaking with the correct person using two identifiers. Patient/Parent Location: at home   I discussed the limitations, risks, security and privacy concerns of performing an evaluation and management service by telephone and the availability of in person appointments. I also discussed with the parents that there may be a patient responsible charge related to this service. The parents expressed understanding and agreed to proceed.  Provider: Carron Curie, NP  Location: work  HPI/CURRENT STATUS: Kristine Edwards is here for medication management of the psychoactive medications for ADHD and review of educational and behavioral concerns.   Kristine Edwards currently taking Vyvanse and Zoloft, which is working well. Takes medication as directed. Medication tends to wear off around evening time Kristine Edwards is able to focus through school/homework.   Kristine Edwards is eating well (eating breakfast, lunch and dinner). Eating well with no issues.   Sleeping well (getting enough sleep), sleeping through the night. Sleep schedule is still off and not on a normal schedule each day.  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 Helping with cardiovascular and attempting to strengthen  her back.   Screen time: (phone, tablet, TV, computer): computer for school work  MEDICAL HISTORY: Individual Medical History/ Review of Systems: No recently reported by patient.   Family Medical/ Social History: Changes? Yes Patient Lives with: parents  MENTAL HEALTH: Mental Health Issues:   Anxiety-history of anxiety with Zoloft controlling her symptoms. Some increased can be caused by future endeavors and schooling.   Studying for her permit test and studying online tests. Wanting to get her permit or license soon. This will allow her to look outside [place] for work.   Allergies: No Known Allergies Current Outpatient Medications  Medication Instructions  . ivabradine (CORLANOR) 7.5 MG TABS tablet TAKE 1 TABLET(7.5 MG) BY MOUTH TWICE DAILY  . [START ON 04/15/2021] lisdexamfetamine (VYVANSE) 30 MG capsule TAKE (1) CAPSULE DAILY.  Kristine Edwards ON 04/15/2021] lisdexamfetamine (VYVANSE) 50 MG capsule TAKE (1) CAPSULE DAILY.  Kristine Edwards sertraline (ZOLOFT) 100 mg, Oral, Daily   Medication Side Effects: None  DIAGNOSES:    ICD-10-CM   1. ADHD (attention deficit hyperactivity disorder), combined type  F90.2 lisdexamfetamine (VYVANSE) 30 MG capsule  2. Anxiety  F41.9   3. Inappropriate sinus tachycardia  R00.0   4. Dysgraphia  R27.8   5. Learning difficulty  F81.9   6. Medication management  Z79.899   7. History of unhealthy sleep-wake schedule  Z72.821   8. Patient counseled  Z71.9   9. Goals of care, counseling/discussion  Z71.89    ASSESSMENT: Patient to graduate from her online college program this month. She is finished with her classes this week and needing to finish projects for her classes. Mother and patient to travel to [place] to attend her graduation ceremony. Patient has  continued with support services as needed for her online program with no concerns or issues with academics. 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 Some opportunities are available with making a contact with a service in [place]. Now needing to obtain her driver's license for more changes to get a job outside of [place] with no needing her parents to transport her to and from the job site. Sleep schedule is still off but she is getting plenty of sleep. No eating or medical changes in the past few months. Kristine Edwards provided information on efficacy for her Vyvanse 50 mg daily on school days and 30 mg on days that she doesn't need to complete school work. Zoloft 100 mg daily has continued to assist with her anxiety levels. No reported side effects for her medications. To continue with current medication regimen and doses.   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   Patient has continued with academic support as needed with her online schooling for her ADHD, Anxiety and Dysgraphia.   This is Lynnell's last week of school and completion of projects for graduation due the end of the week. Patient reports attending in person graduation in [place] with her mother. Support given with her academic success.   Discussed her need for getting her permit and licensure to drive to work. 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   Reviewed work/job opportunities locally and in [place]. 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 May need to shadow or intern with specialist for experience along with making connections for others in the same wildlife rehab services.   Recommended individual counseling for emotional dysregulation and ADHD coping skills. Lateasha has attended counseling before and may need assistance with future endeavors and executive functioning with applications for work.    Reviewed bedtime routine and the need for good sleep hygiene. Schedule has been off and shifted by several hours with waking and sleeping due to school work and helping at home with the dogs.   Encouraged physical activity to continue with attending the gym at least 3 days each week. Mostly focusing on cardiovascular exercises and strengthening her core along with back muscles due to history of scoliosis.  Counseled medication pharmacokinetics, options, dosage, administration, desired effects, and possible side effects.   Zoloft 100 mg no Rx today Vyvanse 50 mg daily # 30 with no RF's post dated for 04/15/21 Vyvanse 30 mg daily, # 30 with no RF's post dated for 04/15/21 RX for above e-scribed and sent to pharmacy on record  [facility] [place], [place] - [address] [place] [address] Phone: [phone] Fax: [phone]  I discussed the assessment and treatment plan with the patient. The patient was provided an opportunity to ask questions and all were answered. The patient agreed with the plan and demonstrated an understanding of the instructions.   I provided 45 minutes of non-face-to-face time during this encounter. Completed record review for 10 minutes prior to the virtual video visit.   NEXT APPOINTMENT:  07/30/2021  Return in about 3 months (around 07/09/2021) for f/uu visit.  The patient was advised to call back or seek an in-person  evaluation if the symptoms worsen or if the condition fails to improve as anticipated.   Carron Curie, NP

## 2021-04-23 ENCOUNTER — Telehealth: Payer: Self-pay | Admitting: Cardiovascular Disease

## 2021-04-23 DIAGNOSIS — R000 Tachycardia, unspecified: Secondary | ICD-10-CM

## 2021-04-23 NOTE — Telephone Encounter (Signed)
*  STAT* If patient is at the pharmacy, call can be transferred to refill team.   1. Which medications need to be refilled? (please list name of each medication and dose if known) ivabradine (CORLANOR) 7.5 MG TABS tablet  2. Which pharmacy/location (including street and city if local pharmacy) is medication to be sent to? [id number] - [place], [place] - [address]  3. Do they need a 30 day or 90 day supply? 90

## 2021-05-01 MED ORDER — IVABRADINE HCL 7.5 MG PO TABS: ORAL_TABLET | ORAL | 3 refills | Status: DC

## 2021-05-01 NOTE — Telephone Encounter (Signed)
Patient's mother called back to say that the prescription was never filled. Stated that her daughter is out of this medication and needs to be filled as soon as possible. Please advise and patient mother would like a call back once the prescription is sent over.

## 2021-05-01 NOTE — Addendum Note (Signed)
Addended by: Alyson Ingles on: 05/01/2021 09:19 AM   Modules accepted: Orders

## 2021-05-19 ENCOUNTER — Other Ambulatory Visit: Payer: Self-pay

## 2021-05-19 DIAGNOSIS — F411 Generalized anxiety disorder: Secondary | ICD-10-CM

## 2021-05-19 DIAGNOSIS — F902 Attention-deficit hyperactivity disorder, combined type: Secondary | ICD-10-CM

## 2021-05-19 MED ORDER — LISDEXAMFETAMINE DIMESYLATE 50 MG PO CAPS: ORAL_CAPSULE | ORAL | 0 refills | Status: DC

## 2021-05-19 MED ORDER — LISDEXAMFETAMINE DIMESYLATE 30 MG PO CAPS: ORAL_CAPSULE | ORAL | 0 refills | Status: DC

## 2021-05-19 MED ORDER — SERTRALINE HCL 100 MG PO TABS: 100.0000 mg | ORAL_TABLET | Freq: Every day | ORAL | 2 refills | Status: DC

## 2021-05-19 NOTE — Telephone Encounter (Signed)
E-Prescribed Vyvanse 50, Vyvanse 30 and Zoloft 100 directly to  [id number] [place], [place] - [address] [facility] [address] [place] [address] Phone: [phone] Fax: [phone]

## 2021-05-26 ENCOUNTER — Other Ambulatory Visit: Payer: Self-pay | Admitting: Family

## 2021-05-26 DIAGNOSIS — F411 Generalized anxiety disorder: Secondary | ICD-10-CM

## 2021-05-26 NOTE — Telephone Encounter (Signed)
Pharmacy continued to send Rx request for Zoloft 100 mg daily, # 30 with 2 RF's. Denied several and sent the last Rx in for coverage for this month. Unsure about insurance coverage. RX for above e-scribed and sent to pharmacy on record  [facility] [place], [place] - [address] [place] [address] Phone: [phone] Fax: [phone]

## 2021-05-29 NOTE — Progress Notes (Signed)
Cardiology Clinic Note   Patient Name: Kristine Edwards Date of Encounter: 06/02/2021  Primary Care Provider:  Gweneth Dimitri, MD Primary Cardiologist:  Chilton Si, MD  Patient Profile    Kristine Edwards 25 year old female presents the clinic today for follow-up evaluation of her inappropriate sinus tachycardia.  Past Medical History    Past Medical History:  Diagnosis Date   ADD (attention deficit disorder) 12/17/2015   ADHD (attention deficit hyperactivity disorder)    Anxiety 12/17/2015   Inappropriate sinus tachycardia 02/17/2016   Shortness of breath 12/17/2015   Sinus tachycardia 12/17/2015   Past Surgical History:  Procedure Laterality Date   ADENOIDECTOMY     WISDOM TOOTH EXTRACTION      Allergies  No Known Allergies  History of Present Illness    Kristine Edwards has a PMH of inappropriate sinus tachycardia, PFO, and ADHD.  She was seen and evaluated for her accelerated heart rates 12/16.  She had presented to the emergency department with a fractured elbow.  At that time her heart rate was in the 115-128 range.  She reported that she had been evaluated since middle school and noted exercise intolerance.  She was seen by pediatric cardiologist in 2014.  She was tachycardic at that time and more 2 Holter monitors.  Her first monitor was worn while taking Vyvanse and her second was without the medication.  There was no change in her tachycardia.  Her ambulatory monitor and echo were reportedly unremarkable other than her tachycardia and PFO.  Her echocardiogram 1/17 showed an LVEF of 60-65% and no other significant abnormalities.  Her lab work showed normal kidney function, thyroid function, and electrolytes.  She was started on metoprolol which did not improve her symptoms.  She was started on ivabradine which was increased to 7.5 mg.  She was seen in follow-up by Dr. Duke Salvia on 11/06/2019.  During that time she was doing well.  She was planning to study  animal behavior in college.  She had been taking classes online due to COVID-19.  She reported gaining weight due to decreased physical activity.  She was not doing any formal exercise.  She denied palpitations, syncope, and presyncope.  She had been out of her ivabradine for a week.  She reported that her heart rate was well controlled when she was taking the medication.  She reported that her mood had been stable.  She denied exertional chest pain and shortness of breath.  She denied lower extremity swelling orthopnea PND.  She presents to the clinic today for follow-up evaluation states she continues to do well.  She has only been taking her Corlanor daily for the last year.  She reports that her heart rates have been maintained around 80.  She continues to be very physically active exercising 4-5 times a week for about an hour to an hour and a half.  She does cardio as well as strengthening type exercises.  She reports that her diet could be better.  She is now working on eating smaller portions and plans to also eat better/more nutritious foods.  We reviewed the new draw bridge site.  I will have her maintain her physical activity, continue to work on her diet, and follow-up in 12 months.  We will request her recent labs from her PCP.  Today she denies chest pain, shortness of breath, lower extremity edema, fatigue, palpitations, melena, hematuria, hemoptysis, diaphoresis, weakness, presyncope, syncope, orthopnea, and PND.   Home Medications  Prior to Admission medications   Medication Sig Start Date End Date Taking? Authorizing Provider  ivabradine (CORLANOR) 7.5 MG TABS tablet TAKE 1 TABLET(7.5 MG) BY MOUTH TWICE DAILY 05/01/21   Chilton Si, MD  lisdexamfetamine (VYVANSE) 30 MG capsule TAKE (1) CAPSULE DAILY. 05/19/21   Dedlow, Ether Griffins, NP  lisdexamfetamine (VYVANSE) 50 MG capsule TAKE (1) CAPSULE DAILY. 05/19/21   Lorina Rabon, NP  sertraline (ZOLOFT) 100 MG tablet Take 1 tablet (100 mg  total) by mouth daily. 05/26/21   Paretta-Leahey, Miachel Roux, NP    Family History    Family History  Problem Relation Age of Onset   Seizures Maternal Grandmother    Cancer Maternal Grandfather    Heart disease Maternal Grandfather    Stroke Paternal Grandfather    Other Father    She indicated that her mother is alive. She indicated that her father is alive. She indicated that all of her five sisters are alive. She indicated that her brother is alive. She indicated that her maternal grandmother is deceased. She indicated that her maternal grandfather is deceased. She indicated that her paternal grandmother is alive. She indicated that her paternal grandfather is deceased.  Social History    Social History   Socioeconomic History   Marital status: Single    Spouse name: Not on file   Number of children: Not on file   Years of education: Not on file   Highest education level: Not on file  Occupational History   Not on file  Tobacco Use   Smoking status: Never   Smokeless tobacco: Never  Substance and Sexual Activity   Alcohol use: Not on file   Drug use: Not on file   Sexual activity: Not on file  Other Topics Concern   Not on file  Social History Narrative   Epworth Sleepiness Scale = 5 (as of 12/17/2015)   Social Determinants of Health   Financial Resource Strain: Not on file  Food Insecurity: Not on file  Transportation Needs: Not on file  Physical Activity: Not on file  Stress: Not on file  Social Connections: Not on file  Intimate Partner Violence: Not on file     Review of Systems    General:  No chills, fever, night sweats or weight changes.  Cardiovascular:  No chest pain, dyspnea on exertion, edema, orthopnea, palpitations, paroxysmal nocturnal dyspnea. Dermatological: No rash, lesions/masses Respiratory: No cough, dyspnea Urologic: No hematuria, dysuria Abdominal:   No nausea, vomiting, diarrhea, bright red blood per rectum, melena, or  hematemesis Neurologic:  No visual changes, wkns, changes in mental status. All other systems reviewed and are otherwise negative except as noted above.  Physical Exam    VS:  BP 130/62   Pulse 74   Ht 5\' 1"  (1.549 m)   Wt 156 lb 12.8 oz (71.1 kg)   SpO2 97%   BMI 29.63 kg/m  , BMI Body mass index is 29.63 kg/m. GEN: Well nourished, well developed, in no acute distress. HEENT: normal. Neck: Supple, no JVD, carotid bruits, or masses. Cardiac: RRR, no murmurs, rubs, or gallops. No clubbing, cyanosis, edema.  Radials/DP/PT 2+ and equal bilaterally.  Respiratory:  Respirations regular and unlabored, clear to auscultation bilaterally. GI: Soft, nontender, nondistended, BS + x 4. MS: no deformity or atrophy. Skin: warm and dry, no rash. Neuro:  Strength and sensation are intact. Psych: Normal affect.  Accessory Clinical Findings    Recent Labs: No results found for requested labs within last 8760  hours.   Recent Lipid Panel No results found for: CHOL, TRIG, HDL, CHOLHDL, VLDL, LDLCALC, LDLDIRECT  ECG personally reviewed by me today-normal sinus rhythm 78 bpm no ST or T wave deviation.  Echocardiogram 12/30/2015 Study Conclusions   - Left ventricle: The cavity size was normal. Wall thickness was    normal. Systolic function was normal. The estimated ejection    fraction was in the range of 60% to 65%. Wall motion was normal;    there were no regional wall motion abnormalities. The study is    not technically sufficient to allow evaluation of LV diastolic    function.  - Left atrium: The atrium was normal in size.   Impressions:   - LVEF 60-65%, normal wall thickness, normal wall motion, normal LA    size, atrial septum poorly visualized, no significant TR, normal    IVC.   Assessment & Plan   1.  Inappropriate sinus tachycardia-EKG today shows normal sinus rhythm 78 bpm.  Reports compliance with her ivabradine but has only been taking it daily for 1 year.  Continues to  do well while on medication reports heart rates in the is maintained around 80.  She denies lightheadedness, dizziness, presyncope and syncope. Continue ivabradine Heart healthy low-sodium diet-salty 6 given Increase physical activity as tolerated Request labs from PCP  Disposition: Follow-up with Dr. Duke Salvia in 12 months.  Thomasene Ripple. Lilit Cinelli NP-C    06/02/2021, 4:29 PM [facility] [address] Office [phone] Fax [phone]  Notice: This dictation was prepared with Dragon dictation along with smaller phrase technology. Any transcriptional errors that result from this process are unintentional and may not be corrected upon review.  I spent 15 minutes examining this patient, reviewing medications, and using patient centered shared decision making involving her cardiac care.  Prior to her visit I spent greater than 20 minutes reviewing her past medical history,  medications, and prior cardiac tests.

## 2021-06-02 ENCOUNTER — Encounter: Payer: Self-pay | Admitting: General Practice

## 2021-06-02 ENCOUNTER — Other Ambulatory Visit: Payer: Self-pay

## 2021-06-02 ENCOUNTER — Ambulatory Visit (INDEPENDENT_AMBULATORY_CARE_PROVIDER_SITE_OTHER): Payer: 59 | Admitting: General Practice

## 2021-06-02 VITALS — BP 130/62 | HR 74 | Ht 61.0 in | Wt 156.8 lb

## 2021-06-02 DIAGNOSIS — R000 Tachycardia, unspecified: Secondary | ICD-10-CM

## 2021-06-02 MED ORDER — IVABRADINE HCL 7.5 MG PO TABS: 7.5000 mg | ORAL_TABLET | Freq: Every day | ORAL | 3 refills | Status: DC

## 2021-06-02 NOTE — Patient Instructions (Signed)
Medication Instructions:  The current medical regimen is effective;  continue present plan and medications as directed. Please refer to the Current Medication list given to you today.  *If you need a refill on your cardiac medications before your next appointment, please call your pharmacy*  Lab Work:   Testing/Procedures:  NONE    NONE  Special Instructions PLEASE READ AND FOLLOW SALTY 6-ATTACHED-1,800mg  daily  PLEASE INCREASE PHYSICAL ACTIVITY AS TOLERATED  Follow-Up: Your next appointment:  12 month(s) In Person with You may see Chilton Si, MD IF UNAVAILABLE JESSE CLEAVER, FNP-C  or one of the following Advanced Practice Providers on your designated Care Team:  Marjie Skiff, PA-C  Please call our office 2 months in advance to schedule this appointment   At [facility], you and your health needs are our priority.  As part of our continuing mission to provide you with exceptional heart care, we have created designated Provider Care Teams.  These Care Teams include your primary Cardiologist (physician) and Advanced Practice Providers (APPs -  Physician Assistants and Nurse Practitioners) who all work together to provide you with the care you need, when you need it.  We recommend signing up for the patient portal called "MyChart".  Sign up information is provided on this After Visit Summary.  MyChart is used to connect with patients for Virtual Visits (Telemedicine).  Patients are able to view lab/test results, encounter notes, upcoming appointments, etc.  Non-urgent messages can be sent to your provider as well.   To learn more about what you can do with MyChart, go to [email or web address].

## 2021-06-20 ENCOUNTER — Telehealth: Payer: Self-pay | Admitting: Family

## 2021-06-20 DIAGNOSIS — F902 Attention-deficit hyperactivity disorder, combined type: Secondary | ICD-10-CM

## 2021-06-20 MED ORDER — LISDEXAMFETAMINE DIMESYLATE 30 MG PO CAPS: ORAL_CAPSULE | ORAL | 0 refills | Status: DC

## 2021-06-20 MED ORDER — LISDEXAMFETAMINE DIMESYLATE 50 MG PO CAPS: ORAL_CAPSULE | ORAL | 0 refills | Status: DC

## 2021-06-20 NOTE — Telephone Encounter (Signed)
Vyvanse 30 mg and 50 mg daily, # 30 each Rx with no RF's for both Rx's.RX for above e-scribed and sent to pharmacy on record  [facility] [place], [place] - [address] [place] [address] Phone: [phone] Fax: [phone]

## 2021-06-20 NOTE — Telephone Encounter (Signed)
Pt's Mom called to refill: Vyvanse 30 mg and 50 mg. Please send the refill to [facility] at [address], phone [phone].

## 2021-07-21 ENCOUNTER — Other Ambulatory Visit: Payer: Self-pay

## 2021-07-21 DIAGNOSIS — F902 Attention-deficit hyperactivity disorder, combined type: Secondary | ICD-10-CM

## 2021-07-21 MED ORDER — LISDEXAMFETAMINE DIMESYLATE 50 MG PO CAPS: ORAL_CAPSULE | ORAL | 0 refills | Status: DC

## 2021-07-21 MED ORDER — LISDEXAMFETAMINE DIMESYLATE 30 MG PO CAPS: ORAL_CAPSULE | ORAL | 0 refills | Status: DC

## 2021-07-21 NOTE — Telephone Encounter (Signed)
Vyvanse 50 mg and 30 mg each daily, # 30 with no RF's for each Rx. RX for above e-scribed and sent to pharmacy on record  [facility] [phone] [place], [place] [address] AT [facility] [address] [place] [address] Phone: [phone] Fax: [phone]

## 2021-07-30 ENCOUNTER — Ambulatory Visit (INDEPENDENT_AMBULATORY_CARE_PROVIDER_SITE_OTHER): Payer: 59 | Admitting: Family

## 2021-07-30 ENCOUNTER — Encounter: Payer: Self-pay | Admitting: Family

## 2021-07-30 ENCOUNTER — Other Ambulatory Visit: Payer: Self-pay

## 2021-07-30 VITALS — BP 112/72 | HR 76 | Resp 16 | Ht 61.0 in | Wt 155.4 lb

## 2021-07-30 DIAGNOSIS — F902 Attention-deficit hyperactivity disorder, combined type: Secondary | ICD-10-CM | POA: Diagnosis not present

## 2021-07-30 DIAGNOSIS — R000 Tachycardia, unspecified: Secondary | ICD-10-CM | POA: Diagnosis not present

## 2021-07-30 DIAGNOSIS — F819 Developmental disorder of scholastic skills, unspecified: Secondary | ICD-10-CM

## 2021-07-30 DIAGNOSIS — F419 Anxiety disorder, unspecified: Secondary | ICD-10-CM | POA: Diagnosis not present

## 2021-07-30 DIAGNOSIS — R278 Other lack of coordination: Secondary | ICD-10-CM

## 2021-07-30 DIAGNOSIS — Z719 Counseling, unspecified: Secondary | ICD-10-CM

## 2021-07-30 DIAGNOSIS — Z79899 Other long term (current) drug therapy: Secondary | ICD-10-CM

## 2021-07-30 NOTE — Progress Notes (Signed)
[facility] [address] [place] [address] Dept: [phone] Dept Fax: [phone] Loc: [phone] Loc Fax: [phone]  Medication Check  Patient ID: Kristine Edwards, female  DOB: Feb 18, 1996, 25 y.o.  MRN: [id number]  Date of Evaluation: 07/30/2021 PCP: Kristine Dimitri, MD  Accompanied by:  self Patient Lives with: parents  HISTORY/CURRENT STATUS: HPI Patient here by herself for the visit today. Interactive and appropriate with provider today. Graduated from her online program this summer and now job searching. Working on getting her driver's license. Working out more and having f/u healthcare as needed. Vyvanse 50 mg and 30 mg each daily along with Zoloft 100 mg daily, no reported side effects.   EDUCATION: School: [employer] Graduated this summer Attended graduation ceremony in [place] Activities/ Exercise: gym for 5 days/week Job Searching now  Driving permit or licensure; 30th of August has written test for driving  MEDICAL HISTORY: Appetite: Good  MVI/Other: None    Sleep: Bedtime: 1-3:00 am  Awakens: sleeping in later in the day  Concerns: Initiation/Maintenance/Other: None reported recently. Some staying up late working on projects.   Individual Medical History/ Review of Systems: Changes? :None reported recently. Has appt tomorrow for her right hip pain related to results after MRI along with orthopedic. Not been to GYN for initial visit. Cardiologist recently with no changes for medications. Dental f/u for routine care.   Allergies: Patient has no known allergies.  Current Medications:  Current Outpatient Medications  Medication Instructions   ivabradine (CORLANOR) 7.5 mg, Oral, Daily   lisdexamfetamine (VYVANSE) 30 MG capsule TAKE (1) CAPSULE DAILY.   lisdexamfetamine (VYVANSE) 50 MG capsule TAKE (1)  CAPSULE DAILY.   sertraline (ZOLOFT) 100 mg, Oral, Daily   Medication Side Effects: None  Family Medical/ Social History: Changes? No  MENTAL HEALTH: Mental Health Issues: Anxiety-Zoloft with good symptom control  PHYSICAL EXAM; Vitals:  Vitals:   07/30/21 1011  BP: 112/72  Pulse: 76  Resp: 16  Weight: 155 lb 6.4 oz (70.5 kg)  Height: 5\' 1"  (1.549 m)    General Physical Exam: Unchanged from previous exam, date:04/08/2021 Changed:None  DIAGNOSES:    ICD-10-CM   1. ADHD (attention deficit hyperactivity disorder), combined type  F90.2     2. Anxiety  F41.9     3. Inappropriate sinus tachycardia  R00.0     4. Dysgraphia  R27.8     5. Learning difficulty  F81.9     6. Medication management  Z79.899     7. Patient counseled  Z71.9      ASSESSMENT: Kristine Edwards is a 25 year old female with a history of ADHD, Anxiety and Learning disability. Has been well maintained on Zoloft and Vyvanse with daily efficacy for school and home. Patient recently graduated from 25 in [employer]. Now looking for a job and attempting to get her driver's license for her to drive to work. Working out 5 days a week and eating to support working out. 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 Has has some recent health care f/u due to routine and specialist. Kristine Edwards will with Vyvanse 50 mg and 30 mg depending on the day along with her Zoloft 100 mg daily with no changes today.   RECOMMENDATIONS:  Patient provided recent health care updates and recent visits. Discussed recent changes in healthcare since last visit on 04/08/2021.  Graduated from her 4  year online program and attended graduation ceremony in [place] at [employer]. Had disability services for completion of her degree with learning support.  Patient now job searching and looking locally, but most jobs she would have to travel. 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  Working on driver's permit and Designer, multimedia. To take her written test on August 30th and may need private instructions for driving.  Discussed recent healthcare issues with hip pain and MRI with f/u for results. Has completed routine healthcare and encouraged f/u with GYN for initial visit.   Patient now going to the gym at least 5 days/week to stay health and work on Runner, broadcasting/film/video. Eating well with no recent changes or issues. Encouraged plenty of water/fluids and protein with exercise.   Counseled medication pharmacokinetics, options, dosage, administration, desired effects, and possible side effects.    Discussed sleep habits and getting at least 8 hours each night with some shift in hours of sleep/wake due to staying up later for projects at home.    I discussed the assessment and treatment plan with the patient. The patient was provided an opportunity to ask questions and all were answered. The patient agreed with the plan and demonstrated an understanding of the instructions.  NEXT APPOINTMENT: Return in about 3 months (around 10/30/2021) for f/u visit.  Carron Curie, NP Counseling Time: 38 mins Total Contact Time: 42 mins

## 2021-08-27 ENCOUNTER — Other Ambulatory Visit: Payer: Self-pay

## 2021-08-27 DIAGNOSIS — F902 Attention-deficit hyperactivity disorder, combined type: Secondary | ICD-10-CM

## 2021-08-27 DIAGNOSIS — F411 Generalized anxiety disorder: Secondary | ICD-10-CM

## 2021-08-27 MED ORDER — SERTRALINE HCL 100 MG PO TABS: 100.0000 mg | ORAL_TABLET | Freq: Every day | ORAL | 2 refills | Status: DC

## 2021-08-27 MED ORDER — LISDEXAMFETAMINE DIMESYLATE 30 MG PO CAPS: ORAL_CAPSULE | ORAL | 0 refills | Status: DC

## 2021-08-27 MED ORDER — LISDEXAMFETAMINE DIMESYLATE 50 MG PO CAPS: ORAL_CAPSULE | ORAL | 0 refills | Status: DC

## 2021-08-27 NOTE — Telephone Encounter (Signed)
E-Prescribed Vyvanse 50 and 30, ans sertraline 100 directly to  [facility] [place], [place] - [address] [place] [address] Phone: [phone] Fax: [phone]

## 2021-10-08 ENCOUNTER — Institutional Professional Consult (permissible substitution): Payer: 59 | Admitting: Family

## 2021-10-29 ENCOUNTER — Other Ambulatory Visit: Payer: Self-pay | Admitting: Pediatrics

## 2021-10-29 DIAGNOSIS — F902 Attention-deficit hyperactivity disorder, combined type: Secondary | ICD-10-CM

## 2021-10-29 NOTE — Telephone Encounter (Signed)
Vyvanse 50 mg daily and 30 mg daily, # 30 with no RF's each Rx.RX for above e-scribed and sent to pharmacy on record  [facility] [place], [place] - [address] [place] [address] Phone: [phone] Fax: [phone]

## 2021-11-28 ENCOUNTER — Other Ambulatory Visit: Payer: Self-pay

## 2021-11-28 DIAGNOSIS — F902 Attention-deficit hyperactivity disorder, combined type: Secondary | ICD-10-CM

## 2021-11-28 DIAGNOSIS — F411 Generalized anxiety disorder: Secondary | ICD-10-CM

## 2021-11-28 MED ORDER — SERTRALINE HCL 100 MG PO TABS: 100.0000 mg | ORAL_TABLET | Freq: Every day | ORAL | 2 refills | Status: DC

## 2021-11-28 MED ORDER — LISDEXAMFETAMINE DIMESYLATE 30 MG PO CAPS: ORAL_CAPSULE | ORAL | 0 refills | Status: DC

## 2021-11-28 MED ORDER — LISDEXAMFETAMINE DIMESYLATE 50 MG PO CAPS: ORAL_CAPSULE | ORAL | 0 refills | Status: DC

## 2021-11-28 NOTE — Telephone Encounter (Signed)
Vyvanse 50 mg and 30 mg each daily, # 30 each Rx with no RF"s and Zoloft 100 mg daily, # 30 with 2 RF's.RX for above e-scribed and sent to pharmacy on record  [facility] [place], [place] - [address] [place] [address] Phone: [phone] Fax: [phone]

## 2021-12-29 ENCOUNTER — Other Ambulatory Visit: Payer: Self-pay

## 2021-12-29 DIAGNOSIS — F902 Attention-deficit hyperactivity disorder, combined type: Secondary | ICD-10-CM

## 2021-12-29 MED ORDER — LISDEXAMFETAMINE DIMESYLATE 30 MG PO CAPS: ORAL_CAPSULE | ORAL | 0 refills | Status: DC

## 2021-12-29 MED ORDER — LISDEXAMFETAMINE DIMESYLATE 50 MG PO CAPS: ORAL_CAPSULE | ORAL | 0 refills | Status: DC

## 2021-12-29 NOTE — Telephone Encounter (Signed)
Vyvanse 50 mg and 30 mg each daily, # 30 each Rx with no RF's.RX for above e-scribed and sent to pharmacy on record  [place], [place] [address] Phone: [phone] Fax: [phone]

## 2022-01-09 ENCOUNTER — Other Ambulatory Visit: Payer: Self-pay

## 2022-01-09 ENCOUNTER — Telehealth (INDEPENDENT_AMBULATORY_CARE_PROVIDER_SITE_OTHER): Payer: 59 | Admitting: Family

## 2022-01-09 ENCOUNTER — Telehealth: Payer: 59 | Admitting: Family

## 2022-01-09 ENCOUNTER — Encounter: Payer: Self-pay | Admitting: Family

## 2022-01-09 DIAGNOSIS — Z719 Counseling, unspecified: Secondary | ICD-10-CM

## 2022-01-09 DIAGNOSIS — F819 Developmental disorder of scholastic skills, unspecified: Secondary | ICD-10-CM

## 2022-01-09 DIAGNOSIS — F902 Attention-deficit hyperactivity disorder, combined type: Secondary | ICD-10-CM | POA: Diagnosis not present

## 2022-01-09 DIAGNOSIS — Z79899 Other long term (current) drug therapy: Secondary | ICD-10-CM

## 2022-01-09 DIAGNOSIS — F419 Anxiety disorder, unspecified: Secondary | ICD-10-CM

## 2022-01-09 DIAGNOSIS — R278 Other lack of coordination: Secondary | ICD-10-CM

## 2022-01-09 DIAGNOSIS — Z7189 Other specified counseling: Secondary | ICD-10-CM

## 2022-01-09 NOTE — Progress Notes (Signed)
[facility] [place] [place] [address] Dept: [phone] Dept Fax: [phone]  Medication Check visit via Virtual Video   Patient ID:  Kristine Edwards  female DOB: 03-19-1996   25 y.o.   MRN: [id number]   DATE:01/09/22  PCP: Cari Caraway, MD  Virtual Visit via Video Note  I connected with  Felina Greynolds on 01/09/22 at 10:30 AM EST by a video enabled telemedicine application and verified that I am speaking with the correct person using two identifiers. Patient/Parent Location: at home    I discussed the limitations, risks, security and privacy concerns of performing an evaluation and management service by telephone and the availability of in person appointments. I also discussed with the parents that there may be a patient responsible charge related to this service. The parents expressed understanding and agreed to proceed.  Provider: Carolann Littler, NP  Location: private work location  HPI/CURRENT STATUS: Kristine Edwards is here for medication management of the psychoactive medications for ADHD and review of educational and behavioral concerns.   Evella currently taking Vyvanse   which is working well. Takes medication daily as directed. Medication tends to wear off around evening time. Ilamae is able to focus through chores and work.   Amarisa is eating well (eating breakfast, lunch and dinner). Nazaria does not have appetite suppression and eating more healthier foods.  Sleeping well (goes to bed at 12:00-2:00 am wakes at 7:00 am), sleeping through the night. Aylee has does not have delayed sleep onset and will nap on occasion.  EDUCATION/WORK: School: Graduated from college Job searching now and applying to local jobs Had a recent interview and possible 2nd phone interview   Activities/ Exercise:  gym 4 times/week  MEDICAL HISTORY: Individual Medical History/ Review  of Systems: Had been to the doctor for visit hip pain with torn labrum  Has been healthy with no visits to the PCP. [place] due yearly.   Family Medical/ Social History: Changes? None Patient Lives with: parents  MENTAL HEALTH: Mental Health Issues:   Anxiety-Zoloft daily with good symptom control.  Allergies: No Known Allergies  Current Medications:  Current Outpatient Medications on File Prior to Visit  Medication Sig Dispense Refill   ivabradine (CORLANOR) 7.5 MG TABS tablet Take 1 tablet (7.5 mg total) by mouth daily. 180 tablet 3   lisdexamfetamine (VYVANSE) 30 MG capsule TAKE (1) CAPSULE DAILY. 30 capsule 0   lisdexamfetamine (VYVANSE) 50 MG capsule TAKE (1) CAPSULE DAILY. 30 capsule 0   sertraline (ZOLOFT) 100 MG tablet Take 1 tablet (100 mg total) by mouth daily. 30 tablet 2   No current facility-administered medications on file prior to visit.   Medication Side Effects: None  DIAGNOSES:    ICD-10-CM   1. ADHD (attention deficit hyperactivity disorder), combined type  F90.2     2. Anxiety  F41.9     3. Dysgraphia  R27.8     4. Problems with learning  F81.9     5. Medication management  Z79.899     6. Patient counseled  Z71.9     7. Goals of care, counseling/discussion  Z71.89      ASSESSMENT:     Kristine Edwards is a 26 year old female with a history of ADHD, Learning difficulties, and Anxiety. She has been well maintained on Vyvanse and Zoloft with good efficacy and no side effects. Not currently working and job searching, but helping at home with  chores and the dogs. Not consistent with her sleep schedule due to staying up later and getting up earlier with the dogs with an occasional nap during the day. [name] is eating healthier food choices and going to the gym at least 4 days/week for exercise. No recent changes with health history in the past few months. Will continue with Vyvanse and Zoloft at the current doses. F/u in 3 months for reassessment.  PLAN/RECOMMENDATIONS:   Updates with work Location manager, interviews and continued looking for work opportunities.  No current work location and has 2 opportunities with potential employment.  Recent job interview and has another potential job interview for another job.   At home helps with chores and assisting with completion of tasks with no difficulties.   Supported continued regular exercise on a weekly basis.  Continued with eating a healthier diet and getting plenty of water.   No changes with healthcare in the past few months. NO changes with her medications for her sinus tachycardia.   Sleep hygiene discussed and sleep schedule discussed due to irregular schedule. Discussed consistency and routine for sleep habits. Limiting naps may be helpful to stay on a schedule.   Counseled medication pharmacokinetics, options, dosage, administration, desired effects, and possible side effects.   Vyvanse 50 mg, no Rx today Vyvanse 30 mg, no Rx today Zoloft 100 mg daily, no Rx today  I discussed the assessment and treatment plan with the patient. The patient was provided an opportunity to ask questions and all were answered. The patient agreed with the plan and demonstrated an understanding of the instructions.   NEXT APPOINTMENT:  04/14/2022-f/u visit Telehealth OK  The patient was advised to call back or seek an in-person evaluation if the symptoms worsen or if the condition fails to improve as anticipated.  Carolann Littler, NP

## 2022-01-10 ENCOUNTER — Encounter: Payer: Self-pay | Admitting: Family

## 2022-02-03 ENCOUNTER — Other Ambulatory Visit: Payer: Self-pay

## 2022-02-03 DIAGNOSIS — F902 Attention-deficit hyperactivity disorder, combined type: Secondary | ICD-10-CM

## 2022-02-03 MED ORDER — LISDEXAMFETAMINE DIMESYLATE 30 MG PO CAPS: ORAL_CAPSULE | ORAL | 0 refills | Status: DC

## 2022-02-03 MED ORDER — LISDEXAMFETAMINE DIMESYLATE 50 MG PO CAPS: ORAL_CAPSULE | ORAL | 0 refills | Status: DC

## 2022-02-03 NOTE — Telephone Encounter (Signed)
Vyvanse 50 mg and 30 mg daily, # 30 each Rx with no RF's.RX for above e-scribed and sent to pharmacy on record  [facility] [place], [place] - [address] [place] [address] Phone: [phone] Fax: [phone]

## 2022-02-26 ENCOUNTER — Other Ambulatory Visit: Payer: Self-pay

## 2022-02-26 DIAGNOSIS — F902 Attention-deficit hyperactivity disorder, combined type: Secondary | ICD-10-CM

## 2022-02-26 DIAGNOSIS — F411 Generalized anxiety disorder: Secondary | ICD-10-CM

## 2022-02-27 MED ORDER — LISDEXAMFETAMINE DIMESYLATE 30 MG PO CAPS: ORAL_CAPSULE | ORAL | 0 refills | Status: DC

## 2022-02-27 MED ORDER — LISDEXAMFETAMINE DIMESYLATE 50 MG PO CAPS: ORAL_CAPSULE | ORAL | 0 refills | Status: DC

## 2022-02-27 MED ORDER — SERTRALINE HCL 100 MG PO TABS: 100.0000 mg | ORAL_TABLET | Freq: Every day | ORAL | 2 refills | Status: DC

## 2022-02-27 NOTE — Telephone Encounter (Signed)
Vyvanse 50 mg daily # 30 with no RF's and Vyvanse 30 mg daily with no RF's, Zoloft 100 mg daily # 30 with 2 RF's.Malena Peer for above e-scribed and sent to pharmacy on record ? ?[facility] [place], [place] - [address] ?[address] ?[place] [address] ?Phone: [phone] Fax: [phone] ? ? ?

## 2022-04-14 ENCOUNTER — Institutional Professional Consult (permissible substitution): Payer: 59 | Admitting: Family

## 2022-04-16 ENCOUNTER — Telehealth: Payer: Self-pay

## 2022-04-16 ENCOUNTER — Ambulatory Visit (INDEPENDENT_AMBULATORY_CARE_PROVIDER_SITE_OTHER): Payer: 59 | Admitting: Cardiovascular Disease

## 2022-04-16 ENCOUNTER — Encounter (HOSPITAL_BASED_OUTPATIENT_CLINIC_OR_DEPARTMENT_OTHER): Payer: Self-pay | Admitting: Cardiovascular Disease

## 2022-04-16 VITALS — BP 106/78 | HR 88 | Ht 61.0 in | Wt 150.2 lb

## 2022-04-16 DIAGNOSIS — Z13220 Encounter for screening for lipoid disorders: Secondary | ICD-10-CM

## 2022-04-16 DIAGNOSIS — Z139 Encounter for screening, unspecified: Secondary | ICD-10-CM | POA: Insufficient documentation

## 2022-04-16 DIAGNOSIS — R000 Tachycardia, unspecified: Secondary | ICD-10-CM

## 2022-04-16 HISTORY — DX: Encounter for screening, unspecified: Z13.9

## 2022-04-16 MED ORDER — IVABRADINE HCL 7.5 MG PO TABS: 7.5000 mg | ORAL_TABLET | Freq: Every day | ORAL | 3 refills | Status: DC

## 2022-04-16 NOTE — Telephone Encounter (Signed)
**Note De-Identified [place] Favor Obfuscation** Corlanor PA started through covermymeds. ?Key: [id number] ?

## 2022-04-16 NOTE — Assessment & Plan Note (Signed)
Heart rates are well-controlled on ivabradine.  Continue current dose.  Blood pressures are low so she is not on nodal agents.  She did not yet take her dose today. ?

## 2022-04-16 NOTE — Patient Instructions (Signed)
Medication Instructions:  ?Your physician recommends that you continue on your current medications as directed. Please refer to the Current Medication list given to you today. ? ?*If you need a refill on your cardiac medications before your next appointment, please call your pharmacy* ? ?Lab Work: ?FASTING LP/CMET SOON  ? ?If you have labs (blood work) drawn today and your tests are completely normal, you will receive your results only by: ?MyChart Message (if you have MyChart) OR ?A paper copy in the mail ?If you have any lab test that is abnormal or we need to change your treatment, we will call you to review the results. ? ?Testing/Procedures: ?NONE ? ?Follow-Up: ?At [facility], you and your health needs are our priority.  As part of our continuing mission to provide you with exceptional heart care, we have created designated Provider Care Teams.  These Care Teams include your primary Cardiologist (physician) and Advanced Practice Providers (APPs -  Physician Assistants and Nurse Practitioners) who all work together to provide you with the care you need, when you need it. ? ?We recommend signing up for the patient portal called "MyChart".  Sign up information is provided on this After Visit Summary.  MyChart is used to connect with patients for Virtual Visits (Telemedicine).  Patients are able to view lab/test results, encounter notes, upcoming appointments, etc.  Non-urgent messages can be sent to your provider as well.   ?To learn more about what you can do with MyChart, go to [email or web address].   ? ?Your next appointment:   ?12 month(s) ? ?The format for your next appointment:   ?In Person ? ?Provider:   ?Gillian Shields, NP ?  ? ?2 YEARS WITH DR [place]  ? ? ? ? ? ? ?

## 2022-04-16 NOTE — Progress Notes (Signed)
? ? ?Cardiology Office Note ? ? ?Date:  04/16/2022  ? ?ID:  Kristine Edwards, DOB 1996/06/09, MRN [id number] ? ?PCP:  Gweneth Dimitri, MD  ?Cardiologist:   Chilton Si, MD  ? ?No chief complaint on file. ? ? ?Patient ID: ?Kristine Edwards is a 26 y.o. female with inappropriate sinus tachycardia, PFO, and ADHD who presents for follow-up.  She initially was noted to have inappropriate heart rates in December 2016 when she presented to the emergency department with a fractured elbow.  At that time her heart rate was ranging from 115 to 128.  Her heart rate has been elevated since at least middle school and she's noted exercise intolerance.   Kristine Edwards was evaluated by a pediatric cardiologist in 2014. She was tachycardic at the time and wore two Holter monitors. The first was while taking Vyvanse and the second occurred two days later after holding the medication.  Reportedly, there was no change in her tachycardia.  The ambulatory monitoring and echocardiogram were reportedly unremarkable other than tachycardia and a patent foramen ovale.  In January 2017 she had an echo that revealed LVEF 60-65% and no other significant abnormalities.  Kristine Edwards had lab work that revealed normal kidney function, thyroid function and electrolytes.  She was started on metorpolol but there was no improvement in her symptoms.  She was started on ivabradine and it was increased to 7.5 mg.   ? ?At her last appointment, her heart rates were uncontrolled.  She has been out of ivabradine for a week.  She is otherwise doing well.  She saw Edd Fabian, NP on 05/2021 and was only taking it once per day.  Heart rates were well controlled with this regimen.  She was exercising regularly.  Lately she has been feeling well.  She works as a Loss adjuster, chartered.  She is active and moving throughout the day.  She also goes to the gym 5 days/week.  She has no exertional chest pain or shortness of breath.  She denies lower extremity edema, orthopnea,  or PND.  Her heart rates are typically in the 70s and do not prohibit her from doing anything she wants to do.  She struggles with her diet and has a sweet tooth. ? ? ?Past Medical History:  ?Diagnosis Date  ? ADD (attention deficit disorder) 12/17/2015  ? ADHD (attention deficit hyperactivity disorder)   ? Anxiety 12/17/2015  ? Inappropriate sinus tachycardia 02/17/2016  ? Screening due 04/16/2022  ? Shortness of breath 12/17/2015  ? Sinus tachycardia 12/17/2015  ? ? ?Past Surgical History:  ?Procedure Laterality Date  ? ADENOIDECTOMY    ? WISDOM TOOTH EXTRACTION    ? ? ? ?Current Outpatient Medications  ?Medication Sig Dispense Refill  ? lisdexamfetamine (VYVANSE) 30 MG capsule TAKE (1) CAPSULE DAILY. 30 capsule 0  ? lisdexamfetamine (VYVANSE) 50 MG capsule TAKE (1) CAPSULE DAILY. 30 capsule 0  ? sertraline (ZOLOFT) 100 MG tablet Take 1 tablet (100 mg total) by mouth daily. 30 tablet 2  ? ivabradine (CORLANOR) 7.5 MG TABS tablet Take 1 tablet (7.5 mg total) by mouth daily. 180 tablet 3  ? ?No current facility-administered medications for this visit.  ? ? ?Allergies:   Patient has no known allergies.  ? ? ?Social History:  The patient  reports that she has never smoked. She has never used smokeless tobacco.  ? ?Family History:  The patient's family history includes Cancer in her maternal grandfather; Heart disease in her maternal grandfather; Other in  her father; Seizures in her maternal grandmother; Stroke in her paternal grandfather.  ? ? ?ROS:  Please see the history of present illness.   Otherwise, review of systems are positive for none.   All other systems are reviewed and negative.  ? ? ?PHYSICAL EXAM: ?VS:  BP 106/78 (BP Location: Right Arm, Patient Position: Sitting, Cuff Size: Normal)   Pulse 88   Ht 5\' 1"  (1.549 m)   Wt 150 lb 3.2 oz (68.1 kg)   BMI 28.38 kg/m?  , BMI Body mass index is 28.38 kg/m?. ?GENERAL:  Well appearing ?HEENT: Pupils equal round and reactive, fundi not visualized, oral mucosa  unremarkable ?NECK:  No jugular venous distention, waveform within normal limits, carotid upstroke brisk and symmetric, no bruits, no thyromegaly ?LUNGS:  Clear to auscultation bilaterally ?HEART:  Tachycardic.  Regular rhythm.   PMI not displaced or sustained,S1 and S2 within normal limits, no S3, no S4, no clicks, no rubs, no murmurs ?ABD:  Flat, positive bowel sounds normal in frequency in pitch, no bruits, no rebound, no guarding, no midline pulsatile mass, no hepatomegaly, no splenomegaly ?EXT:  2 plus pulses throughout, no edema, no cyanosis no clubbing ?SKIN:  No rashes no nodules ?NEURO:  Cranial nerves II through XII grossly intact, motor grossly intact throughout ?PSYCH:  Cognitively intact, oriented to person place and time ? ? ?EKG:  EKG is ordered today. ?05/19/16: Sinus tachycardia rate 118 bpm. ?01/08/17: Sinus tachycardia. Rate 10 8/m.  ?04/13/17: Sinus rhythm.  Rate 86 bpm.  ?11/06/19: Sinus tachycardia.  Rate 108 bpm. ?04/16/2022: Sinus rhythm.  Rate 88 bpm. ? ?Recent Labs: ?No results found for requested labs within last 8760 hours.  ? ?11/16/16: ?WBC 7.4 ?Hemoglobin 10.5, hematocrit 32.6, platelets 386 ? ? ?Lipid Panel ?No results found for: CHOL, TRIG, HDL, CHOLHDL, VLDL, LDLCALC, LDLDIRECT ?  ? ?Wt Readings from Last 3 Encounters:  ?04/16/22 150 lb 3.2 oz (68.1 kg)  ?06/02/21 156 lb 12.8 oz (71.1 kg)  ?11/06/19 143 lb (64.9 kg)  ?  ? ? ?ASSESSMENT AND PLAN: ? ?Inappropriate sinus tachycardia ?Heart rates are well-controlled on ivabradine.  Continue current dose.  Blood pressures are low so she is not on nodal agents.  She did not yet take her dose today. ? ?Screening due ?She will come for fasting lipids.  We also discussed the importance of continuing to get at least 150 minutes of exercise and working on her diet. ? ? ?Current medicines are reviewed at length with the patient today.  The patient does not have concerns regarding medicines. ? ?The following changes have been made:  No change ? ?Labs/  tests ordered today include:  ? ?Orders Placed This Encounter  ?Procedures  ? Lipid panel  ? Comprehensive metabolic panel  ? EKG 12-Lead  ? ? ?Disposition:   FU with Melita Villalona C. 11/08/19, MD, [facility] in 1 year ? ? ?This note was written with the assistance of speech recognition software.  Please excuse any transcriptional errors. ? ?Signed, ?Casimer Russett C. [facility], MD, [facility]  ?04/16/2022 8:46 AM    ?[facility] ?

## 2022-04-16 NOTE — Assessment & Plan Note (Signed)
She will come for fasting lipids.  We also discussed the importance of continuing to get at least 150 minutes of exercise and working on her diet. ?

## 2022-04-28 ENCOUNTER — Ambulatory Visit (INDEPENDENT_AMBULATORY_CARE_PROVIDER_SITE_OTHER): Payer: 59 | Admitting: Family

## 2022-04-28 ENCOUNTER — Encounter: Payer: Self-pay | Admitting: Family

## 2022-04-28 VITALS — BP 102/78 | HR 74 | Resp 16 | Ht 61.0 in | Wt 149.0 lb

## 2022-04-28 DIAGNOSIS — F419 Anxiety disorder, unspecified: Secondary | ICD-10-CM

## 2022-04-28 DIAGNOSIS — F819 Developmental disorder of scholastic skills, unspecified: Secondary | ICD-10-CM

## 2022-04-28 DIAGNOSIS — R000 Tachycardia, unspecified: Secondary | ICD-10-CM

## 2022-04-28 DIAGNOSIS — F902 Attention-deficit hyperactivity disorder, combined type: Secondary | ICD-10-CM

## 2022-04-28 DIAGNOSIS — Z7189 Other specified counseling: Secondary | ICD-10-CM

## 2022-04-28 DIAGNOSIS — Z79899 Other long term (current) drug therapy: Secondary | ICD-10-CM | POA: Diagnosis not present

## 2022-04-28 DIAGNOSIS — Z719 Counseling, unspecified: Secondary | ICD-10-CM

## 2022-04-28 DIAGNOSIS — R278 Other lack of coordination: Secondary | ICD-10-CM

## 2022-04-28 MED ORDER — LISDEXAMFETAMINE DIMESYLATE 30 MG PO CAPS: ORAL_CAPSULE | ORAL | 0 refills | Status: DC

## 2022-04-28 MED ORDER — LISDEXAMFETAMINE DIMESYLATE 50 MG PO CAPS: ORAL_CAPSULE | ORAL | 0 refills | Status: DC

## 2022-04-28 NOTE — Progress Notes (Signed)
[facility] [address] [place] [address] Dept: [phone] Dept Fax: [phone] Loc: [phone] Loc Fax: [phone]  Medication Check  Patient ID: Kristine Edwards, female  DOB: Sep 17, 1996, 26 y.o.  MRN: [id number]  Date of Evaluation: 04/28/2022 PCP: Cari Caraway, MD  Accompanied by:  self Patient Lives with: parents  HISTORY/CURRENT STATUS: HPI Patient here by herself for the visit. Patient interactive and appropriate with provider today. Patient work full time at a new job for the past 3 months. Doing well at work with her duties. No major changes in the past 3 months since her last f/u visit on 01/09/2022. No medication concerns and has continued on her current medication regimen.   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 Initiation/Maintenance/Other: None reported.   Individual Medical History/ Review of Systems: Changes? :Yes had cardiology f/u visit with EKG with no issues reported and medication to remain the same. 2 months ago had gotten bitten by a dog at work with f/u with urgent care. Had x-rays, multiple puncture wounds, glue to the areas that were deeper and placed on Abx. Had 2 other f/u appts after the incident.   Allergies: Patient has no known allergies.  Current Medications:  Current Outpatient Medications  Medication Instructions   ivabradine (CORLANOR) 7.5 mg, Oral, Daily   lisdexamfetamine (VYVANSE) 30 MG capsule TAKE (1) CAPSULE DAILY.   lisdexamfetamine (VYVANSE) 50 MG capsule TAKE (1) CAPSULE DAILY.   sertraline (ZOLOFT) 100 mg, Oral,  Daily   Medication Side Effects: None Family Medical/ Social History: Changes? None reported  MENTAL HEALTH: Mental Health Issues: Anxiety-Zoloft 100 mg daily with no current issues or concerns.   PHYSICAL EXAM; Vitals:   General Physical Exam: Unchanged from previous exam, date:01/09/2022 Changed:None  DIAGNOSES:    ICD-10-CM   1. ADHD (attention deficit hyperactivity disorder), combined type  F90.2 lisdexamfetamine (VYVANSE) 30 MG capsule    2. Anxiety  F41.9     3. Inappropriate sinus tachycardia  R00.0     4. Medication management  Z79.899     5. Patient counseled  Z71.9     6. Goals of care, counseling/discussion  Z71.89     7. Learning difficulty  F81.9     8. Dysgraphia  R27.8      ASSESSMENT:  Kristine Edwards is a 26 year old female with a history of ADHD, L/D, Dysgraphia, and Anxiety. Patient has been well maintained on Vyvanse and Zoloft with good efficacy with no side effects. 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 Doing well with performance and job duties with no help. Had recent issue with dog bite and needed to take 2 weeks off from the bite that occurred at work. Had cardiology f/u recently with no changes or adjustment with medications. Eating well with no changes. Some activity and exercise when able to get to the gym. Sleeping more on a schedule but occasional napping after work. Will continue with Vyvanse and Zoloft as previous with no changes.   RECOMMENDATIONS:  Updates for work, family, health and medical changes since her last f/u visit.  Discussed new job and hire date since last f/u visit.  Duties and job responsibilities discussed with support given.   Recent medical updates and urgent care visit due to her dog bite at school.  Encouraged to attend the gym on a regular basis to continued with her exercise.   Eating well with no concerns and getting plenty to eat during the day.   Sleep hygiene discussed  and sleep schedule discussed due to irregular schedule, but getting better due to her work schedule. Taking naps after work due to later time schedule most nights.   Reviewed sleep consistency and routine for sleep habits. Limiting naps may be helpful to stay on a schedule. If napping only keep it the time to an hour to stay consistent with initiation.   Counseled medication pharmacokinetics, options, dosage, administration, desired effects, and possible side effects.   Vyvanse 50 mg, # 30 with no RF's Vyvanse 30 mg, # 30 with no RF's Zoloft 100 mg daily, no Rx today RX for above e-scribed and sent to pharmacy on record  [place], [place] [address] Phone: [phone] Fax: [phone]  I discussed the assessment and treatment plan with the patient. The patient was provided an opportunity to ask questions and all were answered. The patient agreed with the plan and demonstrated an understanding of the instructions.  NEXT APPOINTMENT: Return in about 3 months (around 07/29/2022) for f/u visit.  The patient was advised to call back or seek an in-person evaluation if the symptoms worsen or if the condition fails to improve as anticipated.  Carolann Littler, NP

## 2022-05-16 LAB — COMPREHENSIVE METABOLIC PANEL
ALT: 24 IU/L (ref 0–32)
AST: 29 IU/L (ref 0–40)
Albumin/Globulin Ratio: 1.6 (ref 1.2–2.2)
Albumin: 4.6 g/dL (ref 3.9–5.0)
Alkaline Phosphatase: 88 IU/L (ref 44–121)
BUN/Creatinine Ratio: 14 (ref 9–23)
BUN: 12 mg/dL (ref 6–20)
Bilirubin Total: 0.4 mg/dL (ref 0.0–1.2)
CO2: 23 mmol/L (ref 20–29)
Calcium: 9.8 mg/dL (ref 8.7–10.2)
Chloride: 100 mmol/L (ref 96–106)
Creatinine, Ser: 0.85 mg/dL (ref 0.57–1.00)
Globulin, Total: 2.8 g/dL (ref 1.5–4.5)
Glucose: 85 mg/dL (ref 70–99)
Potassium: 4.4 mmol/L (ref 3.5–5.2)
Sodium: 140 mmol/L (ref 134–144)
Total Protein: 7.4 g/dL (ref 6.0–8.5)
eGFR: 98 mL/min/{1.73_m2} (ref >59)

## 2022-05-16 LAB — LIPID PANEL
Chol/HDL Ratio: 3.6 ratio (ref 0.0–4.4)
Cholesterol, Total: 210 mg/dL — ABNORMAL HIGH (ref 100–199)
HDL: 58 mg/dL (ref >39)
LDL Chol Calc (NIH): 124 mg/dL — ABNORMAL HIGH (ref 0–99)
Triglycerides: 159 mg/dL — ABNORMAL HIGH (ref 0–149)
VLDL Cholesterol Cal: 28 mg/dL (ref 5–40)

## 2022-06-02 ENCOUNTER — Other Ambulatory Visit: Payer: Self-pay | Admitting: Family

## 2022-06-02 DIAGNOSIS — F411 Generalized anxiety disorder: Secondary | ICD-10-CM

## 2022-06-02 NOTE — Telephone Encounter (Signed)
RX for above e-scribed and sent to pharmacy on record  [facility] - [place], [place] - [address]  [place] [address] Phone: [phone] Fax: [phone]   

## 2022-06-15 ENCOUNTER — Other Ambulatory Visit: Payer: Self-pay

## 2022-06-15 DIAGNOSIS — F902 Attention-deficit hyperactivity disorder, combined type: Secondary | ICD-10-CM

## 2022-06-15 MED ORDER — LISDEXAMFETAMINE DIMESYLATE 50 MG PO CAPS: ORAL_CAPSULE | ORAL | 0 refills | Status: DC

## 2022-06-15 MED ORDER — LISDEXAMFETAMINE DIMESYLATE 30 MG PO CAPS: ORAL_CAPSULE | ORAL | 0 refills | Status: DC

## 2022-06-15 NOTE — Telephone Encounter (Signed)
Vyvanse 50 mg and 30 mg each Rx # 30 with no RF's for both Rx's.RX for above e-scribed and sent to pharmacy on record  [facility] [place], [place] - [address] [place] [address] Phone: [phone] Fax: [phone]

## 2022-06-16 ENCOUNTER — Telehealth: Payer: Self-pay | Admitting: Cardiovascular Disease

## 2022-06-16 NOTE — Telephone Encounter (Signed)
-----   Message from Chilton Si, MD sent at 06/14/2022  9:52 AM EDT ----- Cholesterol levels are higher than it should be.  Given her age we do not need to start any medication.  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  Make sure she is exercising at least 150 minutes weekly.  Limit fried foods, fatty foods, red meat, and cheese.  Normal kidney function, liver function, and electrolytes.

## 2022-06-16 NOTE — Telephone Encounter (Signed)
Advised patient of lab results  

## 2022-06-16 NOTE — Telephone Encounter (Signed)
Left message to call back  

## 2022-06-16 NOTE — Telephone Encounter (Signed)
Pt returning nurse's call. Please advise

## 2022-07-17 ENCOUNTER — Other Ambulatory Visit: Payer: Self-pay

## 2022-07-17 DIAGNOSIS — F902 Attention-deficit hyperactivity disorder, combined type: Secondary | ICD-10-CM

## 2022-07-17 MED ORDER — LISDEXAMFETAMINE DIMESYLATE 30 MG PO CAPS: ORAL_CAPSULE | ORAL | 0 refills | Status: DC

## 2022-07-17 MED ORDER — LISDEXAMFETAMINE DIMESYLATE 50 MG PO CAPS: ORAL_CAPSULE | ORAL | 0 refills | Status: DC

## 2022-07-17 NOTE — Telephone Encounter (Signed)
Vyvanse 50 mg and 30 mg each daily # 30 with no RF's for both Rx's.RX for above e-scribed and sent to pharmacy on record  [facility] [place], [place] - [address] [place] [address] Phone: [phone] Fax: [phone]

## 2022-08-07 ENCOUNTER — Telehealth: Payer: 59 | Admitting: Family

## 2022-08-20 ENCOUNTER — Other Ambulatory Visit: Payer: Self-pay

## 2022-08-20 DIAGNOSIS — F411 Generalized anxiety disorder: Secondary | ICD-10-CM

## 2022-08-20 DIAGNOSIS — F902 Attention-deficit hyperactivity disorder, combined type: Secondary | ICD-10-CM

## 2022-08-20 MED ORDER — LISDEXAMFETAMINE DIMESYLATE 30 MG PO CAPS: ORAL_CAPSULE | ORAL | 0 refills | Status: DC

## 2022-08-20 MED ORDER — LISDEXAMFETAMINE DIMESYLATE 50 MG PO CAPS: ORAL_CAPSULE | ORAL | 0 refills | Status: DC

## 2022-08-20 MED ORDER — SERTRALINE HCL 100 MG PO TABS: 100.0000 mg | ORAL_TABLET | Freq: Every day | ORAL | 2 refills | Status: DC

## 2022-08-20 NOTE — Telephone Encounter (Signed)
Vyvanse 50 mg and 30 mg each daily, # 30 with no RF's for both Rx's and Zoloft 100 mg daily, # 30 with 2 RF's.RX for above e-scribed and sent to pharmacy on record  [facility] [place], [place] - [address] [place] [address] Phone: [phone] Fax: [phone]

## 2022-08-27 ENCOUNTER — Telehealth (INDEPENDENT_AMBULATORY_CARE_PROVIDER_SITE_OTHER): Payer: 59 | Admitting: Family

## 2022-08-27 ENCOUNTER — Encounter: Payer: Self-pay | Admitting: Family

## 2022-08-27 DIAGNOSIS — F902 Attention-deficit hyperactivity disorder, combined type: Secondary | ICD-10-CM | POA: Diagnosis not present

## 2022-08-27 DIAGNOSIS — L700 Acne vulgaris: Secondary | ICD-10-CM | POA: Insufficient documentation

## 2022-08-27 DIAGNOSIS — Z719 Counseling, unspecified: Secondary | ICD-10-CM

## 2022-08-27 DIAGNOSIS — R278 Other lack of coordination: Secondary | ICD-10-CM

## 2022-08-27 DIAGNOSIS — F419 Anxiety disorder, unspecified: Secondary | ICD-10-CM

## 2022-08-27 DIAGNOSIS — R000 Tachycardia, unspecified: Secondary | ICD-10-CM

## 2022-08-27 DIAGNOSIS — Z7189 Other specified counseling: Secondary | ICD-10-CM

## 2022-08-27 DIAGNOSIS — F819 Developmental disorder of scholastic skills, unspecified: Secondary | ICD-10-CM

## 2022-08-27 NOTE — Progress Notes (Signed)
[facility] [place] [address] Dept: [phone] Dept Fax: [phone]  Medication Check visit via Virtual Video   Patient ID:  Kristine Edwards  female DOB: [birth year]   26 y.o.   MRN: [id number]   DATE:08/27/22  PCP: Cari Caraway, MD  Virtual Visit via Video Note  I connected with  Rivers Gassmann on 08/27/22 at 10:00 AM EDT by a video enabled telemedicine application and verified that I am speaking with the correct person using two identifiers. Patient/Parent Location: at home  I discussed the limitations, risks, security and privacy concerns of performing an evaluation and management service by telephone and the availability of in person appointments. I also discussed with the parents that there may be a patient responsible charge related to this service. The parents expressed understanding and agreed to proceed.  Provider: Carolann Littler, NP  Location: work location  HPI/CURRENT STATUS: Kristine Edwards is here for medication management of the psychoactive medications for ADHD and review of educational and behavioral concerns.   Kristine Edwards currently taking Vyvanse and Zoloft, which is working well. Takes medication as directed daily. Medication tends to wear off around evening time from her stimulant. Elianah is able to focus through work.   Kristine Edwards is eating well (eating breakfast, lunch and dinner). Kristine Edwards has does not have appetite suppression and attempting to eat less cheese for cholesterol. Vitamin D and Fe supplement.   Sleeping well (goes to bed at 0200 wakes at 10:00 am), sleeping through the night. Kristine Edwards does not have delayed sleep onset, but changes recently with Holiday and sleep schedule is off.   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  Has been healthy with no visits to the PCP. [place] due yearly last month with blood work with Pap Smear completed.  Had updated vaccines.  Family Medical/ Social History: Father having aortic valve replacement and Bi-pass surgery.   Patient Lives with: parents and dogs  MENTAL HEALTH: Mental Health Issues: Anxiety-Zoloft 100 mg daily with symptom control.  Allergies: No Known Allergies  Current Medications:  Current Outpatient Medications  Medication Instructions   cholecalciferol (VITAMIN D3) 1,000 Units, Oral, Daily   FERROUS SULFATE PO 200 mg, Oral, Daily   ivabradine (CORLANOR) 7.5 mg, Oral, Daily   lisdexamfetamine (VYVANSE) 30 MG capsule TAKE (1) CAPSULE DAILY.   lisdexamfetamine (VYVANSE) 50 MG capsule TAKE (1) CAPSULE DAILY.   sertraline (ZOLOFT) 100 mg, Oral, Daily   Medication Side Effects: None  DIAGNOSES:    ICD-10-CM   1. ADHD (attention deficit hyperactivity disorder), combined type  F90.2     2. Anxiety  F41.9     3. Inappropriate sinus tachycardia  R00.0     4. Learning difficulty  F81.9     5. Dysgraphia  R27.8     6. Patient counseled  Z71.9     7. Goals of care, counseling/discussion  Z71.89      ASSESSMENT:    Kristine Edwards is a 26 year old female with a history of ADHD, L/D, and Anxiety. Patient has been maintained on Zoloft and Vyvanse with good efficacy.  Working 5 days/week about 6 hours each day. Looking at other options for work with more pay. Eating well with no problems reported. Has continued  with more activity with work and was working out.  Sleep well with some change in schedule. Recent visit for routine care with PCP with blood work and pap smear. Now taking Fe+ and Vitamin D supplements related to blood work results. Will continue with medications with no changes needed today.   PLAN/RECOMMENDATIONS:  Updates for current work status  with her job and looking at other options for work.  Discussed schedule and daily duties required at work with no support needed.   Health updates with recent visit to PCP with routine care and lab work needed.  Started on supplements with Vitamin D and Fe+ and encouraged healthy eating habits.  Exercise and regular activity encouraged daily for continued health promotion.  Supported driving and getting help from an Secondary school teacher in the near future.   Reviewed family health and father's upcoming cardiac surgery with support given to Beau.  Sleep schedule discussed related to current work schedule with adequate sleep each night.   Medication management with current medication and doses have continued with no changes needed.   Counseled medication pharmacokinetics, options, dosage, administration, desired effects, and possible side effects.   Vyvanse 50 mg daily, no Rx today Vyvanse 30 mg daily, no Rx today Zoloft 100 mg daily, no Rx today   I discussed the assessment and treatment plan with the patient. The patient was provided an opportunity to ask questions and all were answered. The patient agreed with the plan and demonstrated an understanding of the instructions.   NEXT APPOINTMENT:  11/13/2022-f/u visit   Telehealth OK  The patient was advised to call back or seek an in-person evaluation if the symptoms worsen or if the condition fails to improve as anticipated.  Carron Curie, NP

## 2022-09-25 ENCOUNTER — Other Ambulatory Visit: Payer: Self-pay

## 2022-09-25 DIAGNOSIS — F902 Attention-deficit hyperactivity disorder, combined type: Secondary | ICD-10-CM

## 2022-09-28 ENCOUNTER — Other Ambulatory Visit: Payer: Self-pay | Admitting: Family

## 2022-09-28 DIAGNOSIS — F902 Attention-deficit hyperactivity disorder, combined type: Secondary | ICD-10-CM

## 2022-09-29 NOTE — Telephone Encounter (Signed)
RX for above e-scribed and sent to pharmacy on record  [facility] - [place], [place] - [address] [place] [address] Phone: [phone] Fax: [phone]   

## 2022-10-26 ENCOUNTER — Other Ambulatory Visit: Payer: Self-pay | Admitting: Cardiovascular Disease

## 2022-10-26 DIAGNOSIS — R000 Tachycardia, unspecified: Secondary | ICD-10-CM

## 2022-10-26 NOTE — Telephone Encounter (Signed)
Rx(s) sent to pharmacy electronically.  

## 2022-11-04 ENCOUNTER — Other Ambulatory Visit: Payer: Self-pay

## 2022-11-04 DIAGNOSIS — F902 Attention-deficit hyperactivity disorder, combined type: Secondary | ICD-10-CM

## 2022-11-04 MED ORDER — LISDEXAMFETAMINE DIMESYLATE 30 MG PO CAPS: ORAL_CAPSULE | ORAL | 0 refills | Status: DC

## 2022-11-04 MED ORDER — LISDEXAMFETAMINE DIMESYLATE 50 MG PO CAPS: ORAL_CAPSULE | ORAL | 0 refills | Status: DC

## 2022-11-13 ENCOUNTER — Telehealth (INDEPENDENT_AMBULATORY_CARE_PROVIDER_SITE_OTHER): Payer: 59 | Admitting: Family

## 2022-11-13 ENCOUNTER — Encounter: Payer: Self-pay | Admitting: Family

## 2022-11-13 DIAGNOSIS — Z7189 Other specified counseling: Secondary | ICD-10-CM

## 2022-11-13 DIAGNOSIS — F902 Attention-deficit hyperactivity disorder, combined type: Secondary | ICD-10-CM

## 2022-11-13 DIAGNOSIS — R278 Other lack of coordination: Secondary | ICD-10-CM | POA: Diagnosis not present

## 2022-11-13 DIAGNOSIS — F411 Generalized anxiety disorder: Secondary | ICD-10-CM | POA: Diagnosis not present

## 2022-11-13 DIAGNOSIS — Z79899 Other long term (current) drug therapy: Secondary | ICD-10-CM | POA: Diagnosis not present

## 2022-11-13 DIAGNOSIS — Z719 Counseling, unspecified: Secondary | ICD-10-CM

## 2022-11-13 MED ORDER — SERTRALINE HCL 100 MG PO TABS: 100.0000 mg | ORAL_TABLET | Freq: Every day | ORAL | 2 refills | Status: DC

## 2022-11-13 MED ORDER — LISDEXAMFETAMINE DIMESYLATE 50 MG PO CAPS: ORAL_CAPSULE | ORAL | 0 refills | Status: DC

## 2022-11-13 MED ORDER — LISDEXAMFETAMINE DIMESYLATE 30 MG PO CAPS: ORAL_CAPSULE | ORAL | 0 refills | Status: DC

## 2022-11-13 NOTE — Progress Notes (Signed)
[facility] [address] [place]. [address] [place] [address] Dept: [phone] Dept Fax: [phone]  Medication Check visit via Virtual Video   Patient ID:  Kristine Edwards  female DOB: [birth year]   26 y.o.   MRN: [id number]   DATE:11/13/22  PCP: Gweneth Dimitri, MD  Virtual Visit via Video Note I connected with  Kristine Edwards on 11/13/22 at  8:00 AM EST by a video enabled telemedicine application and verified that I am speaking with the correct person using two identifiers. Patient/Parent Location: at home  I discussed the limitations, risks, security and privacy concerns of performing an evaluation and management service by telephone and the availability of in person appointments. I also discussed with the parents that there may be a patient responsible charge related to this service. The parents expressed understanding and agreed to proceed.  Provider: Carron Curie, NP  Location: private work location  HPI/CURRENT STATUS: Kristine Edwards is here for medication management of the psychoactive medications for ADHD and review of educational and behavioral concerns.   Kristine Edwards currently taking Vyvanse and Zoloft daily, which is working well. Takes medication at breakfast time. Medication tends to wear off around evening time. Kristine Edwards is able to focus through work. Depending on the day time schedule is her dose of Vyvanse for that day.   Kristine Edwards is eating well (eating breakfast, lunch and dinner). Kristine Edwards does not have appetite suppression and eating well. Takiing probiotics, Vitamin D, and Fe+. Cutting back on cheese due to cholesterol and trying to eat more "greens" but not many changes at 1 time.  Sleeping well (goes to bed at 3:00 am with napping after work, wakes at 10:30 am), sleeping through the night. Kristine Edwards does not have delayed sleep onset, but changes recently due to driving lessons at 9:59 am.   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 Was going to the gym regularly before her father's surgery.   MEDICAL HISTORY: Individual Medical History/ Review of Systems: Yes, vaccine update with 2nd Gardasil and will get 3rd in March. COVID-19 in October with treatment.  Has been healthy with no visits to the PCP. WCC due yearly and GYN in the past few months.   Family Medical/ Social History:  Kristine Edwards Lives with: parents  MENTAL HEALTH: Mental Health Issues: Anxiety-less now with medication management.   Allergies: No Known Allergies  Current Medications:  Current Outpatient Medications on File Prior to Visit  Medication Sig Dispense Refill   cholecalciferol (VITAMIN D3) 25 MCG (1000 UNIT) tablet Take 1,000 Units by mouth daily.     FERROUS SULFATE PO Take 200 mg by mouth daily.     ivabradine (CORLANOR) 7.5 MG TABS tablet TAKE 1 TABLET(7.5 MG) BY MOUTH TWICE DAILY 180 tablet 1   No current facility-administered medications on file prior to visit.   Medication Side Effects: None  DIAGNOSES:    ICD-10-CM   1. ADHD (attention deficit hyperactivity disorder), combined type  F90.2 lisdexamfetamine (VYVANSE) 30 MG capsule    2. Generalized anxiety disorder  F41.1 sertraline (ZOLOFT) 100 MG tablet    3. Dysgraphia  R27.8     4. Medication management  Z79.899     5. Patient counseled  Z71.9     6. Goals of care, counseling/discussion  Z71.89      ASSESSMENT:      Kristine Edwards is a  26 year old female with a history of ADHD, Anxiety and L/D. Has continued with Vyvanse 50 mg and 30 mg daily depending on the day for her needs with work. Also has continued with her Zoloft daily with good symptom control. Working at the Programmer, systems on a regular basis, but still looking for other opportunities. No current issues reported at work or difficulties with work International aid/development worker. No  medical changes reported, but did have COVID-19 in October. Eating less saturated fats and trying to incorporate greens. Exercising on a regular basis but recently not attending the gym due to father's surgery. Now taking driving instructions with a Agricultural consultant. Sleeping schedule is off due to work and napping after work. Medication regimen to continue with no changes.  PLAN/RECOMMENDATIONS:  Updates for work, hours, responsibilities and changes discussed with patient.   Information regarding healthcare and family health changes in the past 3 months discussed.   Taking her Vitamin D and Fe with probiotics for healthcare needs.   Eating habits have been shifted to less saturated fats and more greens for her cholesterol.  Exercise discussed with recent changes related to continued activity with cessation due to father's surgery and waiting to resume.  Recent start of private driving lessons to obtain her driver's license.   Sleep schedule discussed with changes related work and napping after work, so staying up later  Medication management discussed today with patient with no changes and efficacy reported.  Counseled medication pharmacokinetics, options, dosage, administration, desired effects, and possible side effects.   Vyvanse 50 mg daily, #30 with no RF's post dated for 12/03/2022. Vyvyanse 30 mg daily, #0 with no RF's post dated for 12/03/2022 Zoloft 100 mg daily #90 with no RF's RX for above e-scribed and sent to pharmacy on record  [facility], [place] - [address] [place] [address] Phone: [phone] Fax: [phone]  I discussed the assessment and treatment plan with Kristine Edwards. Kristine Edwards was provided an opportunity to ask questions and all were answered. Kristine Edwards agreed with the plan and demonstrated an understanding of the instructions.  REVIEW OF CHART, FACE TO FACE CLINIC TIME AND DOCUMENTATION TIME  DURING TODAY'S VISIT:  40 mins      NEXT APPOINTMENT:  Visit date not found- Telehealth OK  The patient was advised to call back or seek an in-person evaluation if the symptoms worsen or if the condition fails to improve as anticipated.   Carron Curie, NP

## 2023-01-05 ENCOUNTER — Telehealth: Payer: Self-pay | Admitting: Family

## 2023-01-05 DIAGNOSIS — F411 Generalized anxiety disorder: Secondary | ICD-10-CM

## 2023-01-05 DIAGNOSIS — F902 Attention-deficit hyperactivity disorder, combined type: Secondary | ICD-10-CM

## 2023-01-05 MED ORDER — LISDEXAMFETAMINE DIMESYLATE 30 MG PO CAPS: ORAL_CAPSULE | ORAL | 0 refills | Status: DC

## 2023-01-05 MED ORDER — LISDEXAMFETAMINE DIMESYLATE 50 MG PO CAPS: ORAL_CAPSULE | ORAL | 0 refills | Status: DC

## 2023-01-05 MED ORDER — SERTRALINE HCL 100 MG PO TABS: 100.0000 mg | ORAL_TABLET | Freq: Every day | ORAL | 2 refills | Status: AC

## 2023-01-05 NOTE — Telephone Encounter (Signed)
Vyvanse 30 mg and Vyvanse 50 mg daily #30 each Rx with no RF's, Zoloft 100 mg #30 with 2 RF's.RX for above e-scribed and sent to pharmacy on record  [place], [place] [address] Phone: [phone] Fax: [phone]

## 2023-01-05 NOTE — Telephone Encounter (Signed)
Refill vyvanse 30 and 50 [facility]

## 2023-01-12 ENCOUNTER — Institutional Professional Consult (permissible substitution): Payer: 59 | Admitting: Family

## 2023-02-26 ENCOUNTER — Telehealth: Payer: 59 | Admitting: Family

## 2023-07-05 ENCOUNTER — Telehealth: Payer: Self-pay | Admitting: Cardiovascular Disease

## 2023-07-05 ENCOUNTER — Other Ambulatory Visit: Payer: Self-pay | Admitting: Cardiovascular Disease

## 2023-07-05 DIAGNOSIS — R000 Tachycardia, unspecified: Secondary | ICD-10-CM

## 2023-07-05 MED ORDER — IVABRADINE HCL 7.5 MG PO TABS
7.5000 mg | ORAL_TABLET | Freq: Two times a day (BID) | ORAL | 0 refills | Status: AC
Start: 2023-07-05 — End: ?

## 2023-07-05 NOTE — Telephone Encounter (Signed)
*  STAT* If patient is at the pharmacy, call can be transferred to refill team.   1. Which medications need to be refilled? (please list name of each medication and dose if known) ivabradine (CORLANOR) 7.5 MG TABS tablet   2. Which pharmacy/location (including street and city if local pharmacy) is medication to be sent to?  [id number] - [place], [place] - [address]      3. Do they need a 30 day or 90 day supply? 90 day     Pt has appt scheduled for 09/17/2023.

## 2023-07-05 NOTE — Telephone Encounter (Signed)
Rx request sent to pharmacy.  

## 2023-08-23 DIAGNOSIS — F902 Attention-deficit hyperactivity disorder, combined type: Secondary | ICD-10-CM | POA: Diagnosis not present

## 2023-08-23 DIAGNOSIS — F411 Generalized anxiety disorder: Secondary | ICD-10-CM | POA: Diagnosis not present

## 2023-08-23 DIAGNOSIS — Z719 Counseling, unspecified: Secondary | ICD-10-CM | POA: Diagnosis not present

## 2023-08-24 DIAGNOSIS — E7800 Pure hypercholesterolemia, unspecified: Secondary | ICD-10-CM | POA: Diagnosis not present

## 2023-08-24 DIAGNOSIS — R739 Hyperglycemia, unspecified: Secondary | ICD-10-CM | POA: Diagnosis not present

## 2023-08-24 DIAGNOSIS — D509 Iron deficiency anemia, unspecified: Secondary | ICD-10-CM | POA: Diagnosis not present

## 2023-08-24 DIAGNOSIS — F411 Generalized anxiety disorder: Secondary | ICD-10-CM | POA: Diagnosis not present

## 2023-08-24 DIAGNOSIS — Z1329 Encounter for screening for other suspected endocrine disorder: Secondary | ICD-10-CM | POA: Diagnosis not present

## 2023-08-24 DIAGNOSIS — Z0000 Encounter for general adult medical examination without abnormal findings: Secondary | ICD-10-CM | POA: Diagnosis not present

## 2023-08-31 DIAGNOSIS — R739 Hyperglycemia, unspecified: Secondary | ICD-10-CM | POA: Diagnosis not present

## 2023-08-31 DIAGNOSIS — E7800 Pure hypercholesterolemia, unspecified: Secondary | ICD-10-CM | POA: Diagnosis not present

## 2023-08-31 DIAGNOSIS — Z23 Encounter for immunization: Secondary | ICD-10-CM | POA: Diagnosis not present

## 2023-08-31 DIAGNOSIS — Z1329 Encounter for screening for other suspected endocrine disorder: Secondary | ICD-10-CM | POA: Diagnosis not present

## 2023-08-31 DIAGNOSIS — D509 Iron deficiency anemia, unspecified: Secondary | ICD-10-CM | POA: Diagnosis not present

## 2023-09-03 ENCOUNTER — Other Ambulatory Visit: Payer: Self-pay | Admitting: Cardiovascular Disease

## 2023-09-03 DIAGNOSIS — R000 Tachycardia, unspecified: Secondary | ICD-10-CM

## 2023-09-03 MED ORDER — IVABRADINE HCL 7.5 MG PO TABS
7.5000 mg | ORAL_TABLET | Freq: Two times a day (BID) | ORAL | 0 refills | Status: DC
Start: 2023-09-03 — End: 2023-09-17

## 2023-09-16 NOTE — Progress Notes (Addendum)
Cardiology Office Note:  .   Date:  09/17/2023  ID:  Kristine Edwards, DOB 20-Jun-1996, MRN [id number] PCP: Gweneth Dimitri, MD  [facility] Providers Cardiologist:  Chilton Si, MD    History of Present Illness: .   Kristine Edwards is a 27 y.o. female with hx of inappropriate sinus tachycardia, PFO, ADHD.   Initially noted to have inappropriate heart rates 11/2015 after presenting to ED with broken elbow. Heart rate elevated since middle school with exercise intolerance. Evaluated by pediatric cardiologist in 2014. Wore two monitor with reportedly no change in results on vs off Vyvanse both showing inappropriate sinus tachycardia. January 2017 echo LVEF 60-65%, no other significant abnormalities. Metoprolol did not improve palpitations. She was started on Ivabradine and increased to 7.23mh.   Presents today for follow up. Feeling overall well since last seen. 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 Reports no shortness of breath nor dyspnea on exertion. Reports no chest pain, pressure, or tightness. No edema, orthopnea, PND. Palpitations have been overall controlled on present dose of Ivabradine. Sometimes takes just once per day if she forgets evening dose.   08/2023 total cholesterol 193, HDL 45, LDL 114, triglycerides 199  ROS: Please see the history of present illness.    All other systems reviewed and are negative.   Studies Reviewed: [name]   EKG Interpretation Date/Time:  Friday September 17 2023 09:00:50 EDT Ventricular Rate:  79 PR Interval:  132 QRS Duration:  72 QT Interval:  370 QTC Calculation: 424 R Axis:   100  Text Interpretation: Normal sinus rhythm Rightward axis  No acute ST/T wave changes. Confirmed by Gillian Shields ([id number]) on 09/17/2023 9:05:13 AM    EKG Interpretation Date/Time:  Friday September 17 2023 09:00:50 EDT Ventricular Rate:  79 PR Interval:  132 QRS  Duration:  72 QT Interval:  370 QTC Calculation: 424 R Axis:   100  Text Interpretation: Normal sinus rhythm Rightward axis  No acute ST/T wave changes. Confirmed by Gillian Shields ([id number]) on 09/17/2023 9:05:13 AM   Risk Assessment/Calculations:             Physical Exam:   VS:  BP 122/84 (BP Location: Left Arm, Patient Position: Sitting, Cuff Size: Normal)   Pulse 90   Ht 5\' 1"  (1.549 m)   Wt 157 lb 1.6 oz (71.3 kg)   SpO2 99%   BMI 29.68 kg/m    Wt Readings from Last 3 Encounters:  09/17/23 157 lb 1.6 oz (71.3 kg)  04/16/22 150 lb 3.2 oz (68.1 kg)  06/02/21 156 lb 12.8 oz (71.1 kg)    GEN: Well nourished, well developed in no acute distress NECK: No JVD; No carotid bruits CARDIAC: RRR, no murmurs, rubs, gallops RESPIRATORY:  Clear to auscultation without rales, wheezing or rhonchi  ABDOMEN: Soft, non-tender, non-distended EXTREMITIES:  No edema; No deformity   ASSESSMENT AND PLAN: .    Inappropriate sinus tachycardia - Longstanding history dating back to evaluation with pediatric cardiologist in 2014. Prior monitors with inappropriate sinus tachycardia. Symptoms not previously controlled on Metoprolol. She has been on Ivabradine since 2017 with resolution of symptoms. Symptoms quiescent on present dose ivabradine 7.5mg  BID, continue same - refill provided. Given her relative hypotension and previous hypotension low suspicion she would tolerate calcium channel blocker, would advise against.  Cardiovascular risk factor -  08/2023 LDL 114 which is improved from previous but  not at goal of less than 100.  Encouraged continued lifestyle changes as she has been very active working out at [employer].  Could consider lipoprotein a with next lipid labs to further quantify lipid goals. Recommend aiming for 150 minutes of moderate intensity activity per week and following a heart healthy diet.         Dispo: follow up in 1 year  Signed, Alver Sorrow, NP

## 2023-09-17 ENCOUNTER — Ambulatory Visit (HOSPITAL_BASED_OUTPATIENT_CLINIC_OR_DEPARTMENT_OTHER): Payer: BC Managed Care – PPO | Admitting: Family

## 2023-09-17 ENCOUNTER — Encounter (HOSPITAL_BASED_OUTPATIENT_CLINIC_OR_DEPARTMENT_OTHER): Payer: Self-pay | Admitting: Family

## 2023-09-17 VITALS — BP 122/84 | HR 90 | Ht 61.0 in | Wt 157.1 lb

## 2023-09-17 DIAGNOSIS — I4711 Inappropriate sinus tachycardia, so stated: Secondary | ICD-10-CM | POA: Diagnosis not present

## 2023-09-17 DIAGNOSIS — E782 Mixed hyperlipidemia: Secondary | ICD-10-CM

## 2023-09-17 MED ORDER — IVABRADINE HCL 7.5 MG PO TABS
7.5000 mg | ORAL_TABLET | Freq: Two times a day (BID) | ORAL | 3 refills | Status: DC
Start: 2023-09-17 — End: 2023-09-27

## 2023-09-17 NOTE — Patient Instructions (Signed)
Medication Instructions:  Continue your current medications.   *If you need a refill on your cardiac medications before your next appointment, please call your pharmacy*   Follow-Up: At [facility], you and your health needs are our priority.  As part of our continuing mission to provide you with exceptional heart care, we have created designated Provider Care Teams.  These Care Teams include your primary Cardiologist (physician) and Advanced Practice Providers (APPs -  Physician Assistants and Nurse Practitioners) who all work together to provide you with the care you need, when you need it.  We recommend signing up for the patient portal called "MyChart".  Sign up information is provided on this After Visit Summary.  MyChart is used to connect with patients for Virtual Visits (Telemedicine).  Patients are able to view lab/test results, encounter notes, upcoming appointments, etc.  Non-urgent messages can be sent to your provider as well.   To learn more about what you can do with MyChart, go to [email or web address].    Your next appointment:   1 year(s)  Provider:   Chilton Si, MD or Gillian Shields, NP    Other Instructions  Heart Healthy Diet Recommendations: A low-salt diet is recommended. Meats should be grilled, baked, or boiled. Avoid fried foods. Focus on lean protein sources like fish or chicken with vegetables and fruits. The American Heart Association is a Chief Technology Officer!  American Heart Association Diet and Lifeystyle Recommendations   Exercise recommendations: The American Heart Association recommends 150 minutes of moderate intensity exercise weekly. Try 30 minutes of moderate intensity exercise 4-5 times per week. This could include walking, jogging, or swimming.

## 2023-09-22 ENCOUNTER — Telehealth: Payer: Self-pay | Admitting: Pharmacy Technician

## 2023-09-22 NOTE — Telephone Encounter (Signed)
Pharmacy Patient Advocate Encounter   Received notification from CoverMyMeds that prior authorization for corlanor is required/requested.   Insurance verification completed.   The patient is insured through [facility] .   Per test claim: PA required; [place] submitted [place] [employer] via CoverMyMeds Key/confirmation #/EOC [id number] Status is pending

## 2023-09-24 ENCOUNTER — Other Ambulatory Visit (HOSPITAL_COMMUNITY): Payer: Self-pay

## 2023-09-27 ENCOUNTER — Encounter (HOSPITAL_BASED_OUTPATIENT_CLINIC_OR_DEPARTMENT_OTHER): Payer: Self-pay | Admitting: Family

## 2023-09-27 ENCOUNTER — Other Ambulatory Visit: Payer: Self-pay | Admitting: Cardiovascular Disease

## 2023-09-27 DIAGNOSIS — I4711 Inappropriate sinus tachycardia, so stated: Secondary | ICD-10-CM

## 2023-09-27 NOTE — Telephone Encounter (Signed)
Prior office notes as well as detailed letter explaining the necessity of Ivabradine faxed to [phone] (case #[id number]). See letter for additional details. [employer] and they stated can either request peer-to-peer be scheduled or submit documentation which will be reviewed w/in 1 week but not both. Additional information submitted, await response.   Alver Sorrow, NP

## 2023-09-27 NOTE — Telephone Encounter (Signed)
Pharmacy Patient Advocate Encounter  Received notification from [facility] that Prior Authorization for corlanor has been DENIED.  Full denial letter will be uploaded to the media tab. See denial reason below.   PA #/Case ID/Reference #: [id number]

## 2023-09-28 ENCOUNTER — Encounter (HOSPITAL_BASED_OUTPATIENT_CLINIC_OR_DEPARTMENT_OTHER): Payer: Self-pay

## 2023-09-28 NOTE — Telephone Encounter (Signed)
PA needed for patients meds

## 2023-10-01 NOTE — Telephone Encounter (Signed)
Any able to provider the patient an update on this?

## 2023-10-13 ENCOUNTER — Other Ambulatory Visit: Payer: Self-pay

## 2023-10-13 ENCOUNTER — Emergency Department (HOSPITAL_BASED_OUTPATIENT_CLINIC_OR_DEPARTMENT_OTHER)
Admission: EM | Admit: 2023-10-13 | Discharge: 2023-10-13 | Disposition: A | Discharge status: Home / Self Care | Payer: Worker's Compensation | Attending: Emergency Medicine | Admitting: Emergency Medicine

## 2023-10-13 DIAGNOSIS — Y990 Civilian activity done for income or pay: Secondary | ICD-10-CM | POA: Insufficient documentation

## 2023-10-13 DIAGNOSIS — S6991XA Unspecified injury of right wrist, hand and finger(s), initial encounter: Secondary | ICD-10-CM | POA: Diagnosis present

## 2023-10-13 DIAGNOSIS — W540XXA Bitten by dog, initial encounter: Secondary | ICD-10-CM | POA: Diagnosis not present

## 2023-10-13 DIAGNOSIS — S61431A Puncture wound without foreign body of right hand, initial encounter: Secondary | ICD-10-CM | POA: Diagnosis not present

## 2023-10-13 MED ORDER — AMOXICILLIN-POT CLAVULANATE 875-125 MG PO TABS
1.0000 | ORAL_TABLET | Freq: Once | ORAL | Status: AC
  Administered 2023-10-13: 1 via ORAL
  Filled 2023-10-13: qty 1

## 2023-10-13 MED ORDER — AMOXICILLIN-POT CLAVULANATE 875-125 MG PO TABS: 1.0000 | ORAL_TABLET | Freq: Two times a day (BID) | ORAL | 0 refills | Status: DC

## 2023-10-13 NOTE — ED Triage Notes (Signed)
Patient presents to ED reporting being bit by huskey at her job to right hand. Two punture wounds noted to right middle finger and one puncture noted to palm of hand. Bleeding controlled, patient able to move all fingers and wrist.

## 2023-10-13 NOTE — ED Notes (Signed)
Pt. Right hand soaked in 1/2betadine and 1/2 sterile water. Pt.right had wrapped with 4x4 and kerlex.

## 2023-10-13 NOTE — ED Provider Notes (Signed)
[place] EMERGENCY DEPARTMENT AT [facility] Provider Note   CSN: [id number] Arrival date & time: 10/13/23  1910     History  Chief Complaint  Patient presents with   Animal Bite    Kristine Edwards is a 27 y.o. female with past medical history of ADD, anxiety, shortness of breath, ADHD reporting to emergency room after getting bit by a husky.  Patient works at a [employer] and was removing food bowl from dog when the dog without warning bit her hand.  Dog is required to be up-to-date on vaccinations to be seen a doggy daycare.  Patient has multiple small puncture wounds to right hand.  Patient has two superficial laceration to posterior side and 1 small laceration to palmar aspect.  Bleeding is under control.  Patient was able to wash her hands after incident occurred.  Denies any other associated symptoms.  Patient is able to move hand, has pain over the laceration.     Animal Bite      Home Medications Prior to Admission medications   Medication Sig Start Date End Date Taking? Authorizing Provider  ivabradine (CORLANOR) 7.5 MG TABS tablet TAKE 1 TABLET(7.5 MG) BY MOUTH TWICE DAILY WITH A MEAL 09/27/23   Chilton Si, MD  lisdexamfetamine (VYVANSE) 30 MG capsule TAKE (1) CAPSULE DAILY. 01/05/23   Paretta-Leahey, Miachel Roux, NP  lisdexamfetamine (VYVANSE) 50 MG capsule TAKE (1) CAPSULE DAILY. 01/05/23   Paretta-Leahey, Miachel Roux, NP  sertraline (ZOLOFT) 100 MG tablet Take 1 tablet (100 mg total) by mouth daily. 01/05/23   Paretta-Leahey, Miachel Roux, NP      Allergies    Patient has no known allergies.    Review of Systems   Review of Systems  Skin:  Positive for wound.    Physical Exam Updated Vital Signs BP (!) 141/87 (BP Location: Left Arm)   Pulse 84   Temp (!) 96.7 F (35.9 C) (Tympanic)   Resp 20   Ht 5\' 1"  (1.549 m)   Wt 68 kg   SpO2 100%   BMI 28.34 kg/m  Physical Exam Vitals and nursing note reviewed.  Constitutional:      General: She is not in  acute distress.    Appearance: She is not toxic-appearing.  HENT:     Head: Normocephalic and atraumatic.  Eyes:     General: No scleral icterus.    Conjunctiva/sclera: Conjunctivae normal.  Cardiovascular:     Rate and Rhythm: Normal rate and regular rhythm.     Pulses: Normal pulses.     Heart sounds: Normal heart sounds.  Pulmonary:     Effort: Pulmonary effort is normal. No respiratory distress.     Breath sounds: Normal breath sounds.  Abdominal:     General: Abdomen is flat. Bowel sounds are normal.     Palpations: Abdomen is soft.     Tenderness: There is no abdominal tenderness.  Skin:    General: Skin is warm and dry.     Findings: No lesion.     Comments: 1 small superficial puncture wound to palmar aspect of hand.  Bleeding controlled.  No obvious foreign body palpated or observed in the area.  Patient has 2 posterior superficial puncture wounds to the posterior aspect hand   Neurological:     General: No focal deficit present.     Mental Status: She is alert and oriented to person, place, and time. Mental status is at baseline.     ED Results / Procedures /  Treatments   Labs (all labs ordered are listed, but only abnormal results are displayed) Labs Reviewed - No data to display  EKG None  Radiology No results found.  Procedures Procedures    Medications Ordered in ED Medications - No data to display  ED Course/ Medical Decision Making/ A&P                                 Medical Decision Making Risk Prescription drug management.   Kristine Edwards 27 y.o. presented today for a x2 .5 cm superficial laceration to their posterior hand, .5cm superficial laceration to palmar aspect at area of proximal thumb. Working Ddx: FB, fracture, NV compromise, simple laceration.  R/o DDx: These ddx are considered less likely due to history of present illness and physical exam findings.  Pmhx considered: Anxiety  Patient presented for a x2 .5 cm superficial  laceration to their posterior hand, .5cm superficial laceration to palmar aspect at area of proximal thumb after dog bite. They are neurovascularly intact.  Range of motion and strength of hand equal bilaterally.  Patient has tenderness to palpation directly over dog bite with no obvious foreign body, bleeding well-controlled, superficial.  Tetanus is UTD. Patient is in no distress. Laceration will not need to be repaired secondary to superficial nature and dog bite wound.  Given that bleeding is controlled and area has been well cleaned.  Will start antibiotics 1 dose here in the emergency room and sent with Augmentin outpatient.  Review of prior external notes: None  Labs: None  Imaging: Considered however wound can easily be visualized to have no foreign body.  Patient neurovascularly intact, range of motion intact.  No concern for fracture related to this injury.   Problem List / ED Course / Critical interventions / Medication management  Patient reporting to emergency room with dog bite.  Wounds are superficial, bleeding controlled.  While patient was here area was cleaned thoroughly with disinfectant.  Area was then covered.  Patient is up-to-date on tetanus shot.  Given first dose of antibiotic here pain is under control.  Patient neurovascularly intact with normal range of motion. I ordered medication including Augmentin Reevaluation of the patient after these medicines showed that the patient stayed the same Patients vitals assessed. Upon arrival patient is hemodynamically stable.  I have reviewed the patients home medicines and have made adjustments as needed  Consult: None    Plan:  Augmentin Patient is stable for discharge at this time. Patient/family expressed understanding of return precautions and need for follow-up.  Keep wound clean, covered. Educated on sx of concern regarding complications -- such as infection, poor wound healing, compartment sx.          Final  Clinical Impression(s) / ED Diagnoses Final diagnoses:  None    Rx / DC Orders ED Discharge Orders     None         Raford Pitcher Kristine Edwards 10/13/23 2311    Benjiman Core, MD 10/14/23 0005

## 2023-10-13 NOTE — Discharge Instructions (Signed)
You were seen in the emergency room today for dog bite.  The area has been cleaned.  You have been given your first dose of Augmentin which is an antibiotic.  Your tetanus shot is up-to-date.  I would recommend taking Augmentin as prescribed with food twice daily.  Look for signs of infection including cellulitis, drainage from the wound.  Return to emergency room with new or worsening symptoms.

## 2023-11-23 DIAGNOSIS — F411 Generalized anxiety disorder: Secondary | ICD-10-CM | POA: Diagnosis not present

## 2023-11-23 DIAGNOSIS — F902 Attention-deficit hyperactivity disorder, combined type: Secondary | ICD-10-CM | POA: Diagnosis not present

## 2024-01-31 ENCOUNTER — Telehealth: Payer: Self-pay | Admitting: Pharmacy Technician

## 2024-01-31 ENCOUNTER — Encounter (HOSPITAL_BASED_OUTPATIENT_CLINIC_OR_DEPARTMENT_OTHER): Payer: Self-pay

## 2024-01-31 ENCOUNTER — Other Ambulatory Visit (HOSPITAL_COMMUNITY): Payer: Self-pay

## 2024-01-31 DIAGNOSIS — I4711 Inappropriate sinus tachycardia, so stated: Secondary | ICD-10-CM

## 2024-01-31 NOTE — Telephone Encounter (Signed)
 Team can you tell me if she is needing updated prior auth?

## 2024-01-31 NOTE — Telephone Encounter (Signed)
 Insurance required more information. Faxed 4:29pm 01/31/24

## 2024-01-31 NOTE — Telephone Encounter (Signed)
 Pharmacy Patient Advocate Encounter   Received notification from CoverMyMeds that prior authorization for ivabradine is required/requested.   Insurance verification completed.   The patient is insured through [facility] .   Per test claim: PA required; PA submitted to above mentioned insurance via CoverMyMeds Key/confirmation #/EOC [facility] Status is pending

## 2024-02-01 NOTE — Telephone Encounter (Signed)
 Pharmacy Patient Advocate Encounter  Received notification from [facility] that Prior Authorization for  ivabradine  has been DENIED.  Full denial letter will be uploaded to the media tab. See denial reason below.  This was also denied in October and it was appealed by Gillian Shields, NP and the appeal was denied as well. She does not meet criteria for this drug to be covered under insurance.

## 2024-02-01 NOTE — Telephone Encounter (Signed)
 Detailed letter requesting exception sent to [facility] 09/2023 (see under letter tab and copied below for reference). Per my previous discussion with them can submit additional info (which we did) or do peer-to-peer but not both. Her insurance only covers Ivabradine for heart failure (which she does not have) per prior info from [place].   I'm  not sure what else we can do as her insurance does not cover this Rx. Will route to PharmD to see if they have any other suggestions. From my GoodRx review no inexpensive option to pay out of pocket.  Alver Sorrow, NP  ________________  Copy of letter to insurance from 09/27/23:  To whom it may concern,    Miss Franko was seen 09/17/23 for follow up of inappropriate sinus tachycardia. She was initially seen pediatric cardiology with monitor as early as 2014 revealing inappropriate sinus tachycardia.   She was initially on Metoprolol however it did not resolve symptoms. Please refer to office 04/2016 by Dr. Duke Salvia, "She tired metoprolol but continues to have shorness of breath. Her heart rate remains >100 bpm and her BP is now 98/80. Therefore we cannot titrate any more. We will start Ivabradine 2.5 mg bid with plans to increase to 5mg  bid if her heart rate remains >100 bpm.". Her symptoms have been controlled on Ivabradine since 2017. Her blood pressure is unlikely to tolerate calcium channel blocker and beta blocker previously found to be ineffective.    Per 2015 ACC/AHA Guideline for the Management of Adult Patients with Supraventrical Tachycardia, "Treatment of IST is difficult, and it should be recognized that lowering the heart rate may not alleviate symptoms. Therapy with beta blockers or calcium channel blockers is often ineffective or not well tolerated because of cardiovascular side effects, such as hypotension. Exercise training may be of benefit, but the benefit is unproven."   It is a IIa recommendation that "Ivabradine is reasonable for ongoing  management in patients with symptomatic IST". No reversible causes for her IST have been found (as per class I recommendations)   I will reach out to request a consultation with Valinda Hoar [place] Clinical Pharmacist (call [phone] ext [phone]). As well as fax additional information, recent office note and this letter, to [phone].   Please reconsider denial of Ivabradine based on the aforementioned information.

## 2024-02-01 NOTE — Telephone Encounter (Signed)
Please advise next steps

## 2024-02-03 MED ORDER — IVABRADINE HCL 7.5 MG PO TABS: 7.5000 mg | ORAL_TABLET | Freq: Two times a day (BID) | ORAL | 3 refills | Status: DC

## 2024-02-03 NOTE — Telephone Encounter (Signed)
 MyChart message sent to patient via 01/31/2024 MyChart encounter.  Alver Sorrow, NP

## 2024-02-21 DIAGNOSIS — F902 Attention-deficit hyperactivity disorder, combined type: Secondary | ICD-10-CM | POA: Diagnosis not present

## 2024-02-21 DIAGNOSIS — F411 Generalized anxiety disorder: Secondary | ICD-10-CM | POA: Diagnosis not present

## 2024-05-11 NOTE — Telephone Encounter (Signed)
 Per last documentation via MyChart she was paying out of pocket for Ivabradine . She preferred that over Bisoprolol, but could trial Bisoprolol in the future.  Philomena Buttermore S Suki Crockett, NP

## 2024-05-23 DIAGNOSIS — F411 Generalized anxiety disorder: Secondary | ICD-10-CM | POA: Diagnosis not present

## 2024-05-23 DIAGNOSIS — F902 Attention-deficit hyperactivity disorder, combined type: Secondary | ICD-10-CM | POA: Diagnosis not present

## 2024-07-11 DIAGNOSIS — N898 Other specified noninflammatory disorders of vagina: Secondary | ICD-10-CM | POA: Diagnosis not present

## 2024-08-23 DIAGNOSIS — F411 Generalized anxiety disorder: Secondary | ICD-10-CM | POA: Diagnosis not present

## 2024-08-23 DIAGNOSIS — F902 Attention-deficit hyperactivity disorder, combined type: Secondary | ICD-10-CM | POA: Diagnosis not present

## 2024-08-24 DIAGNOSIS — F988 Other specified behavioral and emotional disorders with onset usually occurring in childhood and adolescence: Secondary | ICD-10-CM | POA: Diagnosis not present

## 2024-08-24 DIAGNOSIS — E559 Vitamin D deficiency, unspecified: Secondary | ICD-10-CM | POA: Diagnosis not present

## 2024-08-24 DIAGNOSIS — D509 Iron deficiency anemia, unspecified: Secondary | ICD-10-CM | POA: Diagnosis not present

## 2024-08-24 DIAGNOSIS — Z0000 Encounter for general adult medical examination without abnormal findings: Secondary | ICD-10-CM | POA: Diagnosis not present

## 2024-08-24 DIAGNOSIS — E7800 Pure hypercholesterolemia, unspecified: Secondary | ICD-10-CM | POA: Diagnosis not present

## 2024-08-24 DIAGNOSIS — F411 Generalized anxiety disorder: Secondary | ICD-10-CM | POA: Diagnosis not present

## 2024-09-27 DIAGNOSIS — M25532 Pain in left wrist: Secondary | ICD-10-CM | POA: Diagnosis not present

## 2024-09-27 DIAGNOSIS — M25531 Pain in right wrist: Secondary | ICD-10-CM | POA: Diagnosis not present

## 2024-11-24 ENCOUNTER — Other Ambulatory Visit (HOSPITAL_COMMUNITY): Payer: Self-pay

## 2024-11-24 MED ORDER — LISDEXAMFETAMINE DIMESYLATE 50 MG PO CAPS
50.0000 mg | ORAL_CAPSULE | Freq: Every morning | ORAL | 0 refills | Status: DC
  Filled 2024-11-24 – 2024-11-25 (×2): qty 30, 30d supply, fill #0

## 2024-11-24 MED ORDER — LISDEXAMFETAMINE DIMESYLATE 30 MG PO CAPS
30.0000 mg | ORAL_CAPSULE | Freq: Every morning | ORAL | 0 refills | Status: DC
  Filled 2024-11-24 – 2024-11-25 (×2): qty 30, 30d supply, fill #0

## 2024-11-25 ENCOUNTER — Other Ambulatory Visit (HOSPITAL_COMMUNITY): Payer: Self-pay

## 2024-11-26 ENCOUNTER — Other Ambulatory Visit (HOSPITAL_COMMUNITY): Payer: Self-pay

## 2024-11-27 ENCOUNTER — Other Ambulatory Visit: Payer: Self-pay

## 2024-12-12 ENCOUNTER — Ambulatory Visit (HOSPITAL_BASED_OUTPATIENT_CLINIC_OR_DEPARTMENT_OTHER): Admitting: Family

## 2024-12-12 ENCOUNTER — Telehealth (HOSPITAL_COMMUNITY): Payer: Self-pay

## 2024-12-12 ENCOUNTER — Encounter (HOSPITAL_BASED_OUTPATIENT_CLINIC_OR_DEPARTMENT_OTHER): Payer: Self-pay | Admitting: Family

## 2024-12-12 ENCOUNTER — Other Ambulatory Visit (HOSPITAL_COMMUNITY): Payer: Self-pay

## 2024-12-12 VITALS — BP 108/80 | HR 105 | Ht 61.0 in | Wt 157.6 lb

## 2024-12-12 DIAGNOSIS — I4711 Inappropriate sinus tachycardia, so stated: Secondary | ICD-10-CM

## 2024-12-12 MED ORDER — IVABRADINE HCL 7.5 MG PO TABS
7.5000 mg | ORAL_TABLET | Freq: Every day | ORAL | 3 refills | Status: AC
  Filled 2024-12-12: qty 30, 30d supply, fill #0
  Filled 2024-12-14: qty 90, 90d supply, fill #0

## 2024-12-12 NOTE — Patient Instructions (Signed)
 Medication Instructions:  Continue your current medications.   *If you need a refill on your cardiac medications before your next appointment, please call your pharmacy*  Testing/Procedures: Your EKG today showed normal sinus rhythm.   Follow-Up: At [facility], you and your health needs are our priority.  As part of our continuing mission to provide you with exceptional heart care, our providers are all part of one team.  This team includes your primary Cardiologist (physician) and Advanced Practice Providers or APPs (Physician Assistants and Nurse Practitioners) who all work together to provide you with the care you need, when you need it.  Your next appointment:   1 year(s)  Provider:   Annabella Scarce, MD, Rosaline Bane, NP, or Reche Finder, NP    We recommend signing up for the patient portal called MyChart.  Sign up information is provided on this After Visit Summary.  MyChart is used to connect with patients for Virtual Visits (Telemedicine).  Patients are able to view lab/test results, encounter notes, upcoming appointments, etc.  Non-urgent messages can be sent to your provider as well.   To learn more about what you can do with MyChart, go to [email or web address].   Other Instructions  Heart Healthy Diet Recommendations: A low-salt diet is recommended. Meats should be grilled, baked, or boiled. Avoid fried foods. Focus on lean protein sources like fish or chicken with vegetables and fruits. The American Heart Association is a Chief Technology Officer!  American Heart Association Diet and Lifeystyle Recommendations   Exercise recommendations: The American Heart Association recommends 150 minutes of moderate intensity exercise weekly. Try 30 minutes of moderate intensity exercise 4-5 times per week. This could include walking, jogging, or swimming.

## 2024-12-12 NOTE — Progress Notes (Signed)
 " Cardiology Office Note:  .   Date:  12/12/2024  ID:  Kristine Edwards, DOB January 10, 1996, MRN [id number] PCP: Sheldon Netter, PA  [facility] Providers Cardiologist:  Annabella Scarce, MD    History of Present Illness: .   Kristine Edwards is a 29 y.o. female with hx of inappropriate sinus tachycardia, PFO, ADHD, Sherman's kyphosis.   Initially noted to have inappropriate heart rates 11/2015 after presenting to ED with broken elbow. Heart rate elevated since middle school with exercise intolerance. Evaluated by pediatric cardiologist in 2014. Wore two monitor with reportedly no change in results on vs off Vyvanse  both showing inappropriate sinus tachycardia. January 2017 echo LVEF 60-65%, no other significant abnormalities. Metoprolol  did not improve palpitations. She was started on Ivabradine  and increased to 7.55mh.   Presents today for follow up. 08/2024 labs with PCP LDL 119. She is presently doing self-pay for ivabradine  which she is taking once per day. Not exercising routinely at this time. Notes lack of motivation. 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 Notes focusing on veggie intake and has added more salads. Reports no shortness of breath nor dyspnea on exertion. Reports no chest pain, pressure, or tightness. No edema, orthopnea, PND.  ROS: Please see the history of present illness.    All other systems reviewed and are negative.   Studies Reviewed: SABRA   EKG Interpretation Date/Time:  Tuesday December 12 2024 09:21:30 EST Ventricular Rate:  100 PR Interval:  124 QRS Duration:  72 QT Interval:  346 QTC Calculation: 446 R Axis:   85  Text Interpretation: Normal sinus rhythm Confirmed by Vannie Mora ([id number]) on 12/12/2024 9:40:51 AM      Risk Assessment/Calculations:             Physical Exam:   VS:  BP 108/80 (BP Location: Left Arm, Patient Position: Sitting, Cuff Size: Normal)   Pulse (!)  105   Ht 5' 1 (1.549 m)   Wt 157 lb 9.6 oz (71.5 kg)   SpO2 97%   BMI 29.78 kg/m    Wt Readings from Last 3 Encounters:  12/12/24 157 lb 9.6 oz (71.5 kg)  10/13/23 150 lb (68 kg)  09/17/23 157 lb 1.6 oz (71.3 kg)    GEN: Well nourished, well developed in no acute distress NECK: No JVD; No carotid bruits CARDIAC: RRR, no murmurs, rubs, gallops RESPIRATORY:  Clear to auscultation without rales, wheezing or rhonchi  ABDOMEN: Soft, non-tender, non-distended EXTREMITIES:  No edema; No deformity   ASSESSMENT AND PLAN: .    Inappropriate sinus tachycardia - Longstanding history dating back to evaluation with pediatric cardiologist in 2014. Prior monitors with inappropriate sinus tachycardia. Symptoms not previously controlled on Metoprolol . She has been on Ivabradine  since 2017 with resolution of symptoms. Symptoms quiescent on present dose ivabradine  7.5mg  QD. Given her relative hypotension and previous hypotension low suspicion she would tolerate calcium channel blocker, would advise against.She is presently doing self-pay for her medication. Reports this is not cost prohibitive. If cost prohibitive in the future, could trial Bisoprolol.   Cardiovascular risk factor -  08/2024 LDL 119 which is not at goal of less than 100. Likely due to inactivity. Encouraged return to the gym starting with low intensity exercises such as recumbent bike.  Recommend aiming for 150 minutes of moderate intensity activity per week and following a heart healthy diet.         Dispo:  follow up in 1 year  Signed, Reche GORMAN Finder, NP   "

## 2024-12-14 ENCOUNTER — Other Ambulatory Visit (HOSPITAL_COMMUNITY): Payer: Self-pay

## 2024-12-14 ENCOUNTER — Telehealth: Payer: Self-pay | Admitting: Pharmacy Technician

## 2024-12-14 NOTE — Telephone Encounter (Signed)
 Per last documentation via MyChart she was paying out of pocket for Ivabradine 

## 2024-12-29 ENCOUNTER — Other Ambulatory Visit (HOSPITAL_COMMUNITY): Payer: Self-pay

## 2024-12-29 ENCOUNTER — Other Ambulatory Visit: Payer: Self-pay

## 2024-12-29 MED ORDER — LISDEXAMFETAMINE DIMESYLATE 30 MG PO CAPS
30.0000 mg | ORAL_CAPSULE | Freq: Every morning | ORAL | 0 refills | Status: AC
  Filled 2024-12-29: qty 30, 30d supply, fill #0

## 2024-12-29 MED ORDER — LISDEXAMFETAMINE DIMESYLATE 50 MG PO CAPS
50.0000 mg | ORAL_CAPSULE | Freq: Every morning | ORAL | 0 refills | Status: AC
  Filled 2024-12-29: qty 30, 30d supply, fill #0
